# Patient Record
Sex: Female | Born: 1965 | Race: White | Hispanic: No | Marital: Married | Smoking: Current every day smoker
Health system: Southern US, Academic
[De-identification: ages and names within clinical notes are randomized; demographics above are authoritative.]

## PROBLEM LIST (undated history)

## (undated) DIAGNOSIS — M51369 Other intervertebral disc degeneration, lumbar region without mention of lumbar back pain or lower extremity pain: Secondary | ICD-10-CM

## (undated) DIAGNOSIS — F419 Anxiety disorder, unspecified: Secondary | ICD-10-CM

## (undated) DIAGNOSIS — E538 Deficiency of other specified B group vitamins: Secondary | ICD-10-CM

## (undated) DIAGNOSIS — E559 Vitamin D deficiency, unspecified: Secondary | ICD-10-CM

## (undated) HISTORY — PX: HX BACK SURGERY: SHX140

## (undated) HISTORY — DX: Other intervertebral disc degeneration, lumbar region without mention of lumbar back pain or lower extremity pain: M51.369

## (undated) HISTORY — PX: HX OTHER: 2100001105

## (undated) HISTORY — DX: Anxiety disorder, unspecified: F41.9

---

## 1997-03-31 ENCOUNTER — Other Ambulatory Visit (HOSPITAL_COMMUNITY): Payer: Self-pay | Admitting: OBSTETRICS/GYNECOLOGY

## 2010-01-28 LAB — ENTER/EDIT EXTERNAL COMMON LAB RESULTS
CHOLESTEROL: 167
HDL-CHOLESTEROL: 64
LDL (CALCULATED): 91
LDL CHOLESTEROL,DIRECT: 12
TRIGLYCERIDES: 60

## 2022-05-31 ENCOUNTER — Telehealth (INDEPENDENT_AMBULATORY_CARE_PROVIDER_SITE_OTHER): Payer: Self-pay | Admitting: Internal Medicine

## 2022-05-31 MED ORDER — BUPROPION HCL SR 100 MG TABLET,12 HR SUSTAINED-RELEASE
100.0000 mg | ORAL_TABLET | Freq: Two times a day (BID) | ORAL | 2 refills | Status: DC
Start: 2022-05-31 — End: 2022-10-05

## 2022-05-31 NOTE — Telephone Encounter (Signed)
Patient called needs to Wellbutrin 100 mg called in to [facility] pctn as well as wanted an appt but she hasn't been seen here since 2018, is it ok for me to schedule her an appt?

## 2022-06-29 ENCOUNTER — Other Ambulatory Visit (INDEPENDENT_AMBULATORY_CARE_PROVIDER_SITE_OTHER): Payer: Self-pay | Admitting: Internal Medicine

## 2022-09-11 ENCOUNTER — Other Ambulatory Visit: Payer: Self-pay

## 2022-09-11 ENCOUNTER — Emergency Department
Admission: EM | Admit: 2022-09-11 | Discharge: 2022-09-11 | Disposition: A | Discharge status: Home / Self Care | Payer: BC Managed Care – PPO | Attending: Family | Admitting: Family

## 2022-09-11 ENCOUNTER — Encounter (HOSPITAL_COMMUNITY): Payer: Self-pay | Admitting: Family

## 2022-09-11 ENCOUNTER — Inpatient Hospital Stay (HOSPITAL_COMMUNITY): Payer: BC Managed Care – PPO

## 2022-09-11 DIAGNOSIS — W19XXXA Unspecified fall, initial encounter: Secondary | ICD-10-CM

## 2022-09-11 DIAGNOSIS — W1839XA Other fall on same level, initial encounter: Secondary | ICD-10-CM | POA: Insufficient documentation

## 2022-09-11 DIAGNOSIS — S8252XA Displaced fracture of medial malleolus of left tibia, initial encounter for closed fracture: Secondary | ICD-10-CM

## 2022-09-11 DIAGNOSIS — S8253XA Displaced fracture of medial malleolus of unspecified tibia, initial encounter for closed fracture: Secondary | ICD-10-CM

## 2022-09-11 MED ORDER — TRAMADOL 50 MG TABLET
1.0000 | ORAL_TABLET | Freq: Four times a day (QID) | ORAL | 0 refills | Status: DC | PRN
Start: 2022-09-11 — End: 2022-10-10

## 2022-09-11 MED ORDER — IBUPROFEN 800 MG TABLET
800.0000 mg | ORAL_TABLET | Freq: Four times a day (QID) | ORAL | 0 refills | Status: DC | PRN
Start: 2022-09-11 — End: 2022-10-05

## 2022-09-11 NOTE — ED Triage Notes (Signed)
States she twisted left ankle and fell today at approx 1400. C/os pain in left ankle with swelling and bruising.

## 2022-09-11 NOTE — ED Provider Notes (Signed)
[facility]  ED Primary Provider Note  History of Present Illness   Chief Complaint   Patient presents with    Fall    Leg Swelling    Ankle Pain     Arrival: The patient arrived by Car  Stephanie Gallegos is a 56 y.o. female who had concerns including Fall, Leg Swelling, and Ankle Pain.  Patient presents to emergency room for evaluation of left ankle swelling after a fall prior to arrival.  Patient states she twisted her ankle is having pain along the medial aspect of the ankle/foot area.  She states her pain is mild at rest but does increase with attempting to ambulate.  Patient states her pain is currently a 5/10.  She declines pain management.  She denies any other injuries or pain.    Review of Systems   All other systems reviewed and are negative except as noted.    Historical Data   History Reviewed This Encounter: Medical History  Surgical History  Family History  Social History    Physical Exam   ED Triage Vitals [09/11/22 1643]   BP (Non-Invasive) (!) 140/104   Heart Rate 92   Respiratory Rate 18   Temperature 36.6 C (97.9 F)   SpO2 99 %   Weight 72.6 kg (160 lb)   Height 1.803 m (5\' 11" )       Constitutional:  56 y.o. female who appears in no distress. Normal color, no cyanosis.   HENT:   Head: Normocephalic and atraumatic.   Mouth/Throat: Oropharynx is clear and moist.   Eyes: EOMI, PERRL   Neck: Trachea midline. Neck supple.  Cardiovascular: RRR, S1, S2 No murmurs, rubs or gallops. Intact distal pulses.  Pulmonary/Chest: BS clear and equal bilaterally.  Respirations even and nonlabored.  No respiratory distress. No wheezes, rales or chest tenderness.   Abdominal: Bowel sounds present and normal. Abdomen soft, no tenderness, no rebound and no guarding.  Back: No midline spinal tenderness, no paraspinal tenderness, no CVA tenderness.           Musculoskeletal:  Left ankle noted with tenderness in the medial aspect of the malleolus.  Soft tissue swelling noted..  Skin: warm  and dry. No rash, erythema, pallor or cyanosis  Psychiatric: normal mood and affect. Behavior is normal.   Neurological: Patient keenly alert and responsive, easily able to raise eyebrows, facial muscles/expressions symmetric, speaking in fluent sentences, moving all extremities equally and fully, normal gait  Patient Data   Labs Ordered/Reviewed - No data to display  XR ANKLE LEFT 2 VIEW   Final Result by Edi, Radresults In (11/13 1604)   SOFT TISSUE SWELLING. NO  FRACTURE IDENTIFIED.                   Radiologist location ID: [id number]         XR FOOT LEFT 2 VIEW   Final Result by Edi, Radresults In (11/13 1605)   Fracture of the medial malleolus with soft tissue swelling.                Radiologist location ID: [id number]           Medical Decision Making        Medical Decision Making  Will discharge patient home to follow-up outpatient with Orthopedic.  Called and discussed patient with Dr. [id number] who is on-call for Orthopedics.  He states the place that posterior splint and have the patient follow up in his office,  they can call tomorrow for appointment.  Will place patient on pain management.  She is to ice the area 5 to 6 times a day for 15-20 minutes.  Keep the area elevated.  Home education provided. Patient return to the ER for any problems or worsening of symptoms    Amount and/or Complexity of Data Reviewed  Radiology: ordered. Decision-making details documented in ED Course.    Risk  Prescription drug management.    Critical Care  Total time providing critical care: 0 minutes      ED Course as of 09/11/22 1718   Mon Sep 11, 2022   1708 Spoke to dr Nicholes Stairs who states to place a posterior splint and have her call the office tomorrow.             Clinical Impression   Closed fracture of medial malleolus - left  (Primary)   Fall, initial encounter       Disposition: Discharged  Current Discharge Medication List        START taking these medications    Details   Ibuprofen (MOTRIN) 800 mg Oral Tablet Take  1 Tablet (800 mg total) by mouth Four times a day as needed for Pain for up to 5 days  Qty: 20 Tablet, Refills: 0      traMADoL (ULTRAM) 50 mg Oral Tablet Take 1 Tablet (50 mg total) by mouth Every 6 hours as needed for Pain for up to 3 days  Qty: 12 Tablet, Refills: 0

## 2022-09-11 NOTE — ED Nurses Note (Signed)
Discharged home, received written and verbal discharge instructions by provider with understanding voiced. Procast applied, pt already has crutches in vehicle. Made aware to follow up with ortho, return to ER for any worsening symptoms. Leaving ER at this time via wheelchair with family.

## 2022-09-11 NOTE — Discharge Instructions (Signed)
FOLLOW UP WITH THE PRIMARY CARE PROVIDER AS SOON AS POSSIBLE BUT NO LATER THAN 3 DAYS NOW.  IF NO PRIMARY CARE PROVIDER EXISTS, THEN THE PATIENT IS INSTRUCTED TO ESTABLISH CARE WITH A PRIMARY CARE PROVIDER. FOLLOW-UP WITH ANY SPECIALIST PROVIDER AS INDICATED AS SOON AS POSSIBLE BUT NO LATER THAN 3 DAYS.  NOTIFY THE PRIMARY CARE PROVIDER THAT YOU WERE IN THE EMERGENCY DEPARTMENT WITHIN 24 HOURS OF DISCHARGE TO FOLLOW-UP ON YOUR RESULTS AND/OR TREATMENTS.  RETURN TO THE EMERGENCY DEPARTMENT IMMEDIATELY IF NEEDED, NO BETTER, WORSE, NEW SYMPTOMS ARISE, OR YOU CANNOT FOLLOW-UP WITH YOUR PRIMARY CARE PROVIDER IN THE PRESCRIBED TIMEFRAME.

## 2022-09-15 ENCOUNTER — Encounter (INDEPENDENT_AMBULATORY_CARE_PROVIDER_SITE_OTHER): Payer: BC Managed Care – PPO | Admitting: Family

## 2022-09-29 ENCOUNTER — Encounter (INDEPENDENT_AMBULATORY_CARE_PROVIDER_SITE_OTHER): Payer: Self-pay | Admitting: Internal Medicine

## 2022-10-05 ENCOUNTER — Encounter (INDEPENDENT_AMBULATORY_CARE_PROVIDER_SITE_OTHER): Payer: Self-pay | Admitting: Internal Medicine

## 2022-10-05 ENCOUNTER — Ambulatory Visit (INDEPENDENT_AMBULATORY_CARE_PROVIDER_SITE_OTHER): Payer: BC Managed Care – PPO | Admitting: Internal Medicine

## 2022-10-05 ENCOUNTER — Other Ambulatory Visit: Payer: Self-pay

## 2022-10-05 VITALS — BP 135/97 | HR 94 | Resp 16 | Ht 70.0 in

## 2022-10-05 DIAGNOSIS — W19XXXD Unspecified fall, subsequent encounter: Secondary | ICD-10-CM

## 2022-10-05 DIAGNOSIS — S82892A Other fracture of left lower leg, initial encounter for closed fracture: Secondary | ICD-10-CM

## 2022-10-05 DIAGNOSIS — F320 Major depressive disorder, single episode, mild: Secondary | ICD-10-CM

## 2022-10-05 DIAGNOSIS — M25552 Pain in left hip: Secondary | ICD-10-CM

## 2022-10-05 DIAGNOSIS — S82892D Other fracture of left lower leg, subsequent encounter for closed fracture with routine healing: Secondary | ICD-10-CM

## 2022-10-05 DIAGNOSIS — M25352 Other instability, left hip: Secondary | ICD-10-CM

## 2022-10-05 MED ORDER — CYCLOBENZAPRINE 10 MG TABLET
10.0000 mg | ORAL_TABLET | Freq: Three times a day (TID) | ORAL | 3 refills | Status: DC | PRN
Start: 2022-10-05 — End: 2023-11-22

## 2022-10-05 MED ORDER — IBUPROFEN 800 MG TABLET
800.0000 mg | ORAL_TABLET | Freq: Four times a day (QID) | ORAL | 3 refills | Status: AC | PRN
Start: 2022-10-05 — End: 2023-04-03

## 2022-10-05 MED ORDER — BUPROPION HCL SR 100 MG TABLET,12 HR SUSTAINED-RELEASE
100.0000 mg | ORAL_TABLET | Freq: Two times a day (BID) | ORAL | 3 refills | Status: DC
Start: 2022-10-05 — End: 2023-07-10

## 2022-10-05 MED ORDER — SERTRALINE 100 MG TABLET
ORAL_TABLET | ORAL | 3 refills | Status: DC
Start: 2022-10-05 — End: 2023-08-23

## 2022-10-05 MED ORDER — DEXAMETHASONE SODIUM PHOSPHATE 4 MG/ML INJECTION SOLUTION
4.0000 mg | INTRAMUSCULAR | Status: AC
Start: 2022-10-05 — End: 2022-10-05
  Administered 2022-10-05: 4 mg via INTRAMUSCULAR

## 2022-10-05 MED ORDER — KETOROLAC 60 MG/2 ML INTRAMUSCULAR SOLUTION
60.0000 mg | Freq: Once | INTRAMUSCULAR | Status: AC
Start: 2022-10-05 — End: 2022-10-05
  Administered 2022-10-05: 60 mg via INTRAMUSCULAR

## 2022-10-05 MED ORDER — CYANOCOBALAMIN (VIT B-12) 1,000 MCG/ML INJECTION SOLUTION
1000.0000 ug | INTRAMUSCULAR | 3 refills | Status: DC
Start: 2022-10-05 — End: 2023-10-01

## 2022-10-05 MED ORDER — KETOROLAC 30 MG/ML (1 ML) INJECTION SOLUTION
60.0000 mg | Freq: Once | INTRAMUSCULAR | Status: DC
Start: 2022-10-05 — End: 2022-10-05

## 2022-10-05 NOTE — Progress Notes (Signed)
[facility]  [address]  [place] [address]       Name: Stephanie Gallegos MRN:  [id number]   Date: 10/05/2022 Age: 56 y.o.       Chief Complaint:    Chief Complaint   Patient presents with    Hip Pain     Lt--Wanting injection--        HPI:  Stephanie Gallegos is a 56 y.o. female who presents to the office today for follow-up.  The patient is actually sustained a injury secondary to a fall in her basement.  She states that she fell against the left side of her body whenever she tripped in her basement.  She sustained an injury to the left ankle in addition to her left lateral hip region.  She was seen by Orthopedics and x-rays were performed which revealed a fracture of her ankle and she currently is in a splint for this and I do not have the actual records from her x-ray to determine the extent of this fracture.  However she has been having increasing amounts of pain of her left hip to the point that she states whenever she starts to get up to walk or whenever she has any movement of her left hip that she has significant pain.  She states from time to time he will feel like it is catching.  She is unable to walk on this she is not had any imaging to be performed of this.  She is not tried any medications as she does not want to be on any medications if at all possible.    Past Medical History:  History reviewed. No pertinent past medical history.  Past Medical History was reviewed and is negative for below    History reviewed. No pertinent surgical history.   Current Outpatient Medications   Medication Sig    buPROPion (WELLBUTRIN SR) 100 mg Oral tablet sustained-release 12 hr Take 1 Tablet (100 mg total) by mouth Twice daily    cyanocobalamin (VITAMIN B12) 1,000 mcg/mL Injection Solution Inject 1 mL (1,000 mcg total) into the muscle Every 30 days    cyclobenzaprine (FLEXERIL) 10 mg Oral Tablet Take 1 Tablet (10 mg total) by mouth Every 8 hours as needed for Muscle spasms     Ibuprofen (MOTRIN) 800 mg Oral Tablet Take 1 Tablet (800 mg total) by mouth Four times a day as needed for Pain for up to 180 days    sertraline (ZOLOFT) 100 mg Oral Tablet TAKE 1 & 1/2 (ONE & ONE-HALF) TABLETS BY MOUTH ONCE DAILY     No Known Allergies    Family History:  Family Medical History:    None           Social History:   Social History     Tobacco Use   Smoking Status Every Day    Types: Cigarettes    Passive exposure: Never   Smokeless Tobacco Never     Social History     Substance and Sexual Activity   Alcohol Use Never     Social History     Occupational History    Not on file       Review of Systems:  Review of systems as discussed in HPI    Problem List:  There is no problem list on file for this patient.      Physical Examination:  BP (!) 135/97 (Site: Left, Patient Position: Sitting, Cuff Size: Adult)  Pulse 94   Resp 16   Ht 1.778 m (5\' 10" )   LMP  (LMP Unknown)   SpO2 99%   BMI 22.96 kg/m       Physical Exam  Vitals and nursing note reviewed.   Constitutional:       General: She is not in acute distress.     Appearance: Normal appearance.   HENT:      Head: Normocephalic and atraumatic.      Right Ear: Tympanic membrane normal.      Left Ear: Tympanic membrane normal.      Nose: Nose normal.      Mouth/Throat:      Mouth: Mucous membranes are moist.      Pharynx: Oropharynx is clear.   Eyes:      General:         Right eye: No discharge.         Left eye: No discharge.      Conjunctiva/sclera: Conjunctivae normal.      Pupils: Pupils are equal, round, and reactive to light.   Cardiovascular:      Rate and Rhythm: Normal rate and regular rhythm.      Pulses:           Carotid pulses are 0 on the right side and 0 on the left side.       Popliteal pulses are 2+ on the right side and 2+ on the left side.        Dorsalis pedis pulses are 2+ on the right side and 2+ on the left side.      Heart sounds: Normal heart sounds.   Pulmonary:      Effort: Pulmonary effort is normal.      Breath  sounds: Normal breath sounds. No wheezing, rhonchi or rales.   Abdominal:      General: Bowel sounds are normal. There is no distension.      Palpations: Abdomen is soft.      Tenderness: There is no abdominal tenderness. There is no rebound.   Musculoskeletal:         General: No signs of injury.      Cervical back: Normal range of motion. No tenderness.      Left hip: Deformity, tenderness and bony tenderness present. Decreased range of motion. Decreased strength.   Lymphadenopathy:      Cervical: No cervical adenopathy.   Skin:     General: Skin is warm.      Capillary Refill: Capillary refill takes less than 2 seconds.      Coloration: Skin is not jaundiced.   Neurological:      General: No focal deficit present.      Mental Status: She is alert and oriented to person, place, and time.      Cranial Nerves: No cranial nerve deficit.      Motor: No weakness.   Psychiatric:         Behavior: Behavior normal.          Health Maintenance:  Health Maintenance   Topic Date Due    Pap smear  Never done    Colonoscopy  Never done    Mammography  Never done    Shingles Vaccine (1 of 2) Never done    Influenza Vaccine (1) Never done    Covid-19 Vaccine (4 - 2023-24 season) 06/30/2022    Depression Screening  10/06/2023    Meningococcal Vaccine  Aged Out    Pneumococcal Vaccine,  Age 55-64  Aged Out    Adult Tdap-Td  Discontinued    Hepatitis B Vaccine  Discontinued    HIV Screening  Discontinued        Assessment:      ICD-10-CM    1. Hip pain, acute, left  M25.552 My concern is that this patient is having severe pain of her left hip that is profound whenever she stands and attempts to extend her hip with walking.  She however is noted to have increasing amounts of pain even with ambulation and on physical exam she has extreme instability of her left hip secondary to severe pain with minimal range of motion.  My concern at this point is that she could be suffering from either a muscle tear, ligament tear, tendon tear or more  importantly a stress fracture of her hip.  Because of her examination I really feel like that an x-ray is going to not provide the appropriate imaging necessary for her condition and I feel that she needs a stat MRI of the hip.    Ibuprofen (MOTRIN) 800 mg Oral Tablet     ketorolac (TORADOL) 30 mg/mL injection     dexAMETHasone 4 mg/mL injection     MRI HIP LEFT WO CONTRAST      2. 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 Closed left ankle fracture  S82.892A Currently stable with splint and I am currently needing to get records as to the extent of her injury.         Orders Placed This Encounter    MRI HIP LEFT WO CONTRAST    cyclobenzaprine (FLEXERIL) 10 mg Oral Tablet    cyanocobalamin (VITAMIN B12) 1,000 mcg/mL Injection Solution    sertraline (ZOLOFT) 100 mg Oral Tablet    buPROPion (WELLBUTRIN SR) 100 mg Oral tablet sustained-release 12 hr    Ibuprofen (MOTRIN) 800 mg Oral Tablet    ketorolac (TORADOL) 30 mg/mL injection    dexAMETHasone 4 mg/mL injection       Depression screening is negative. PHQ 2 Total: 0       Follow up:  No follow-ups on file.    This note was partially created using voice recognition software and is inherently subject to errors including those of syntax and "sound alike " substitutions which may escape proof reading.  In such instances, original meaning may be extrapolated by contextual derivation.    Lucia Gaskins, DO  10/05/2022, 16:27

## 2022-10-10 ENCOUNTER — Other Ambulatory Visit (INDEPENDENT_AMBULATORY_CARE_PROVIDER_SITE_OTHER): Payer: Self-pay | Admitting: Internal Medicine

## 2022-10-10 MED ORDER — TRAMADOL 50 MG TABLET
1.0000 | ORAL_TABLET | Freq: Four times a day (QID) | ORAL | 0 refills | Status: DC | PRN
Start: 2022-10-10 — End: 2022-11-21

## 2022-10-11 ENCOUNTER — Encounter (INDEPENDENT_AMBULATORY_CARE_PROVIDER_SITE_OTHER): Payer: Self-pay | Admitting: Internal Medicine

## 2022-10-11 DIAGNOSIS — M25352 Other instability, left hip: Secondary | ICD-10-CM

## 2022-10-11 DIAGNOSIS — M25552 Pain in left hip: Secondary | ICD-10-CM

## 2022-10-11 IMAGING — MR MRI HIP LT W/O CONTRAST
5 of 7 series · 24 of 40 positions shown · IV contrast (gadolinium)
Comparison: None available

﻿EXAM:  74771   MRI HIP LT W/O CONTRAST
INDICATION: 56-year-old female with left hip pain for a few months. Patient sustained trauma due to fall.  Instability.  No history of prior surgery.
TECHNIQUE: Multiplanar, multisequential MRI of the left hip and pelvis was performed without gadolinium contrast.

[Series 7: T2 fat-sat · axial · left · 4.5mm · 0.94mm/px · z∈[-30,+115]mm · 6 of 30 slices shown (1 of 3)]
[im 1/30]
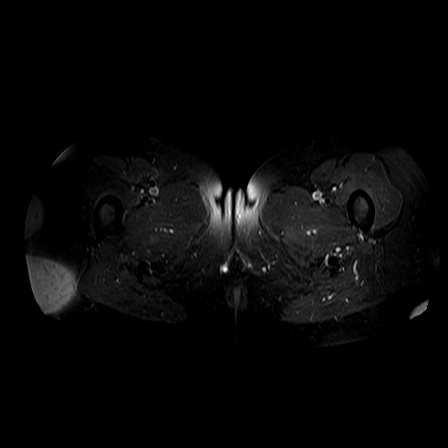
[im 6/30]
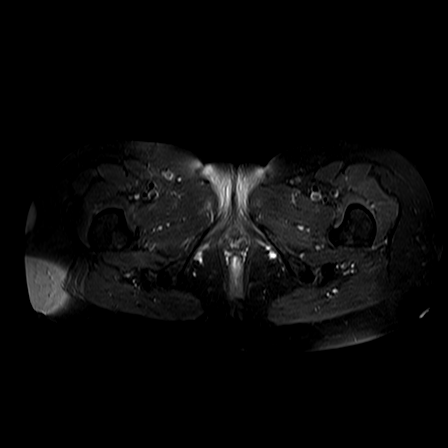
[im 12/30]
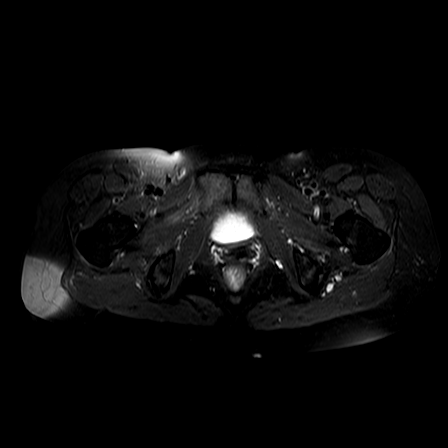
[im 18/30]
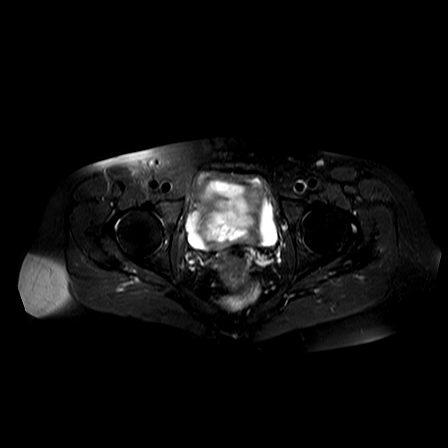
[im 24/30]
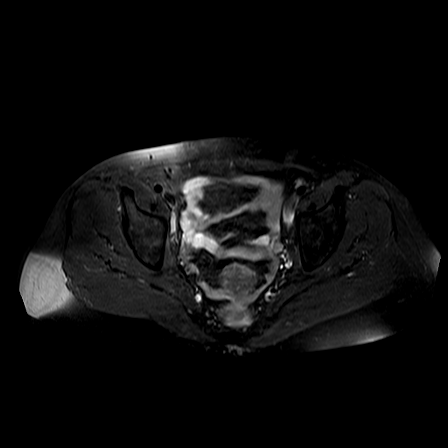
[im 30/30]
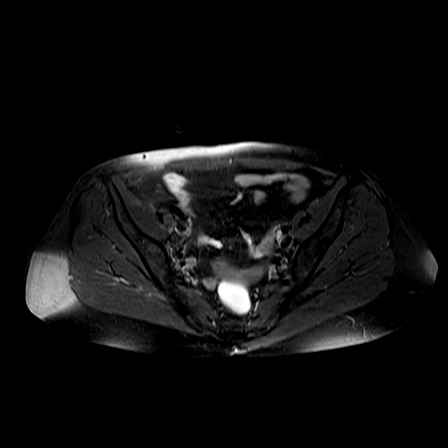

[Series 8: T1 · axial · left · 4.5mm · 0.82mm/px · z∈[-30,+115]mm · 7 of 30 slices shown (1 of 2)]
[im 1/30]
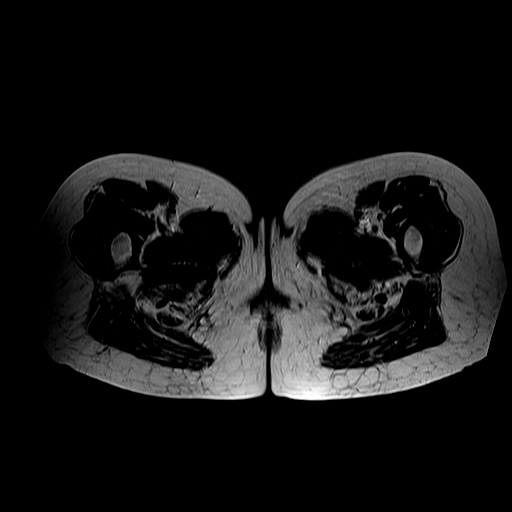
[im 5/30]
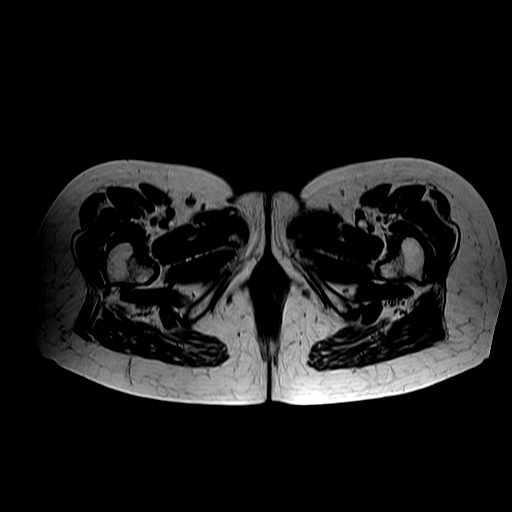
[im 10/30]
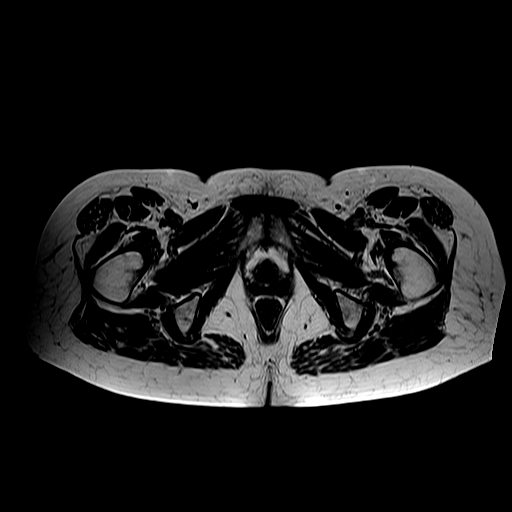
[im 15/30]
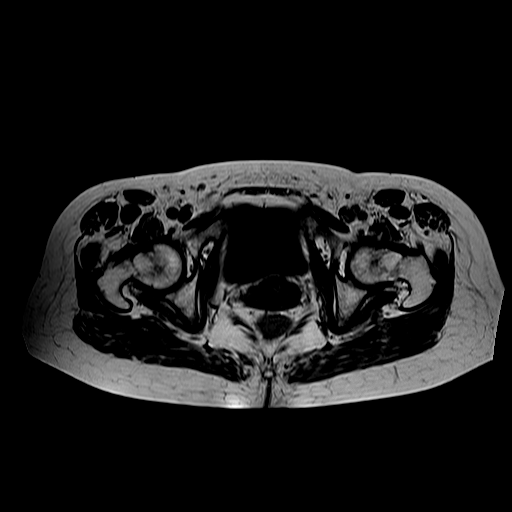
[im 20/30]
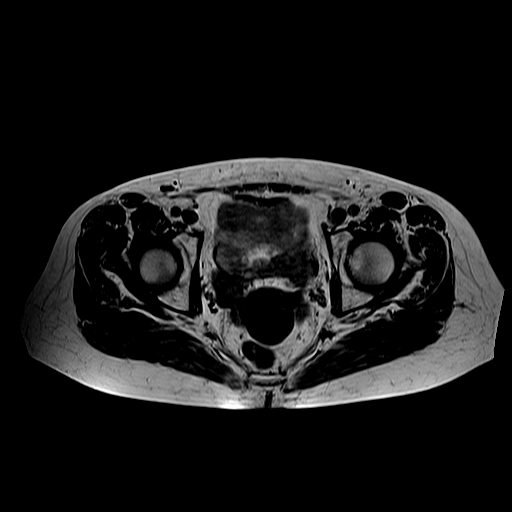
[im 25/30]
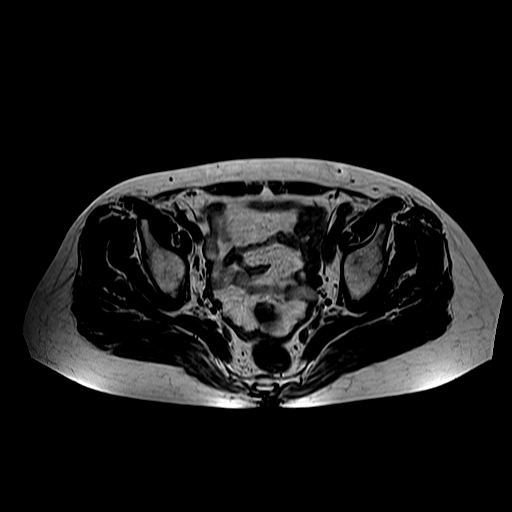
[im 30/30]
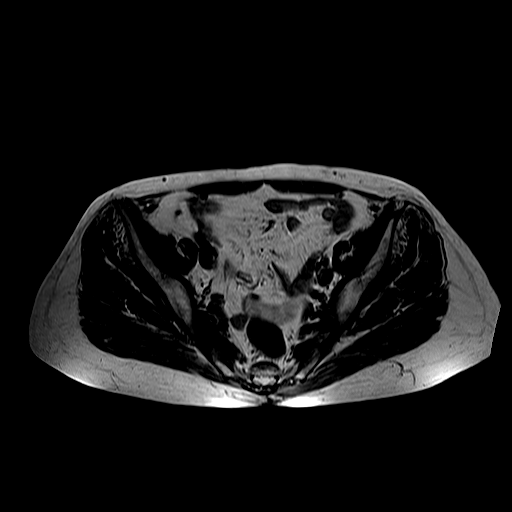

[Series 10: T1 · coronal · left · 4.0mm · 0.82mm/px · 1 of 20 slices shown (2 of 2)]
[im 1/20]
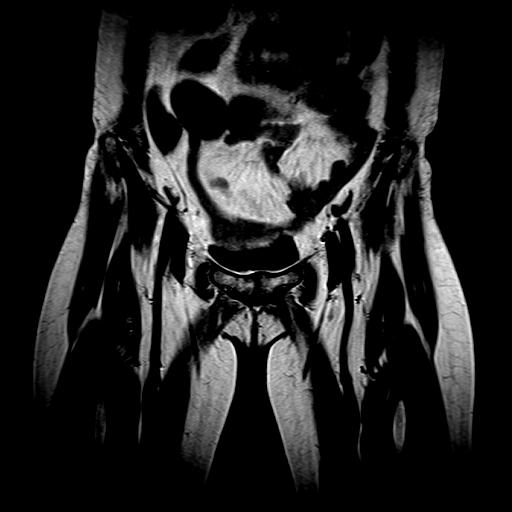

[Series 11: T2 fat-sat · coronal · left · 4.0mm · 0.82mm/px · 5 of 20 slices shown (2 of 3)]
[im 1/20]
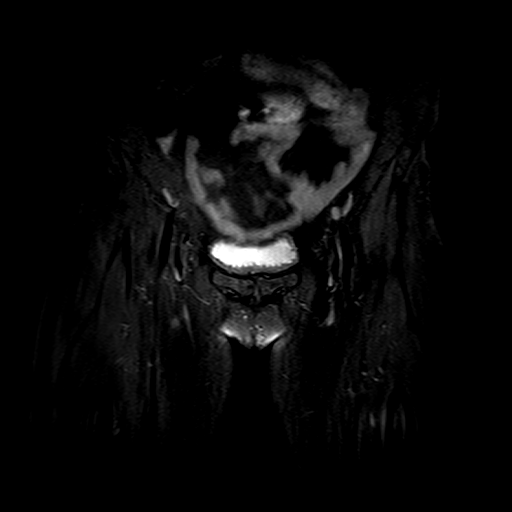
[im 5/20]
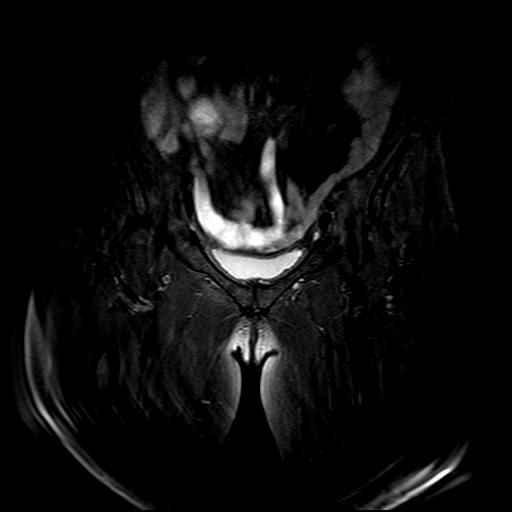
[im 10/20]
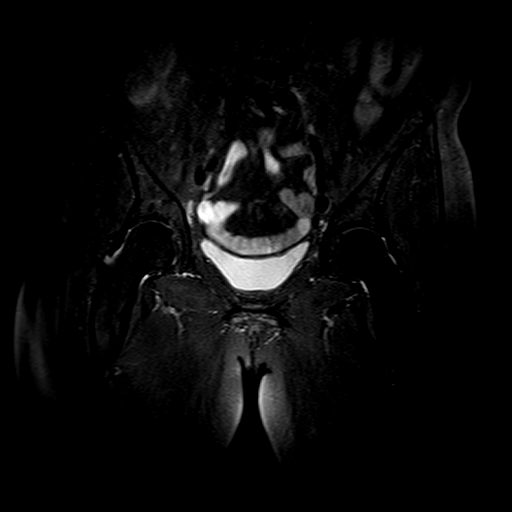
[im 15/20]
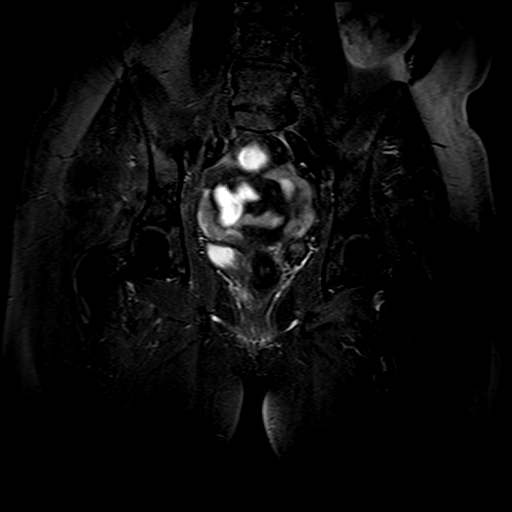
[im 20/20]
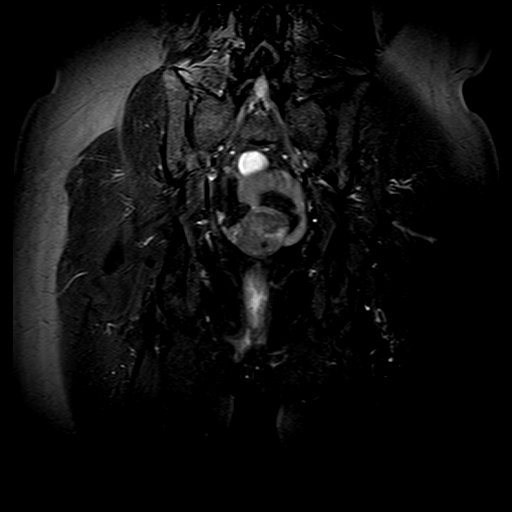

[Series 13: T2 fat-sat · sagittal · left · 4.5mm · 0.78mm/px · 5 of 20 slices shown (3 of 3)]
[im 1/20]
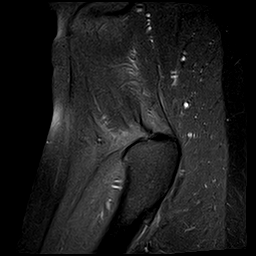
[im 5/20]
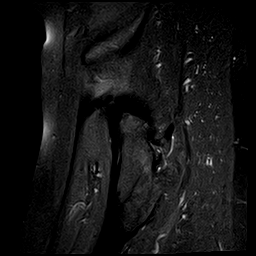
[im 10/20]
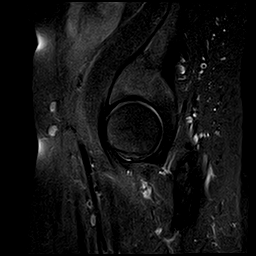
[im 15/20]
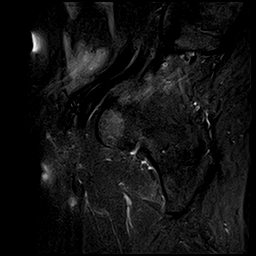
[im 20/20]
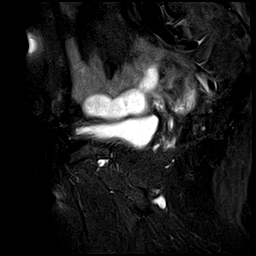

[24 of 40 positions shown; findings below may reference images not displayed]

FINDINGS: No acute fractures or bone bruises are seen involving the pelvis including left hip.  No evidence of avascular necrosis of the femoral head is seen.  Mild osteoarthritis of both hip joints is noted.  No evidence of acetabular labral tear.

Soft tissues at the left hip do not show any acute findings.
IMPRESSION: 1. No acute bony lesions of pelvis and left hip.  No evidence of AVN.

2. Mild degenerative osteoarthritis of both hip joints.

3. No evidence of acetabular labral tear. Soft tissues are unremarkable.

## 2022-10-12 ENCOUNTER — Other Ambulatory Visit (INDEPENDENT_AMBULATORY_CARE_PROVIDER_SITE_OTHER): Payer: Self-pay | Admitting: Internal Medicine

## 2022-10-12 DIAGNOSIS — M545 Low back pain, unspecified: Secondary | ICD-10-CM

## 2022-10-12 DIAGNOSIS — M549 Dorsalgia, unspecified: Secondary | ICD-10-CM

## 2022-10-13 ENCOUNTER — Encounter (INDEPENDENT_AMBULATORY_CARE_PROVIDER_SITE_OTHER): Payer: Self-pay | Admitting: Internal Medicine

## 2022-10-13 DIAGNOSIS — M549 Dorsalgia, unspecified: Secondary | ICD-10-CM

## 2022-10-13 IMAGING — DX XRAY LUMBAR SPINE MINIMUM 4 VIEWS
1 series · 4 of 4 positions shown · non-contrast
Comparison: None available.

﻿EXAM:  [id number]      XRAY LUMBAR SPINE MINIMUM 4 VIEWS
INDICATION: Trauma due to fall in November.  Left back pain and left hip pain.  Left leg numbness.

[Series 1: obl · 0.14mm/px · 4 of 4 slices shown]
[im 1/4]
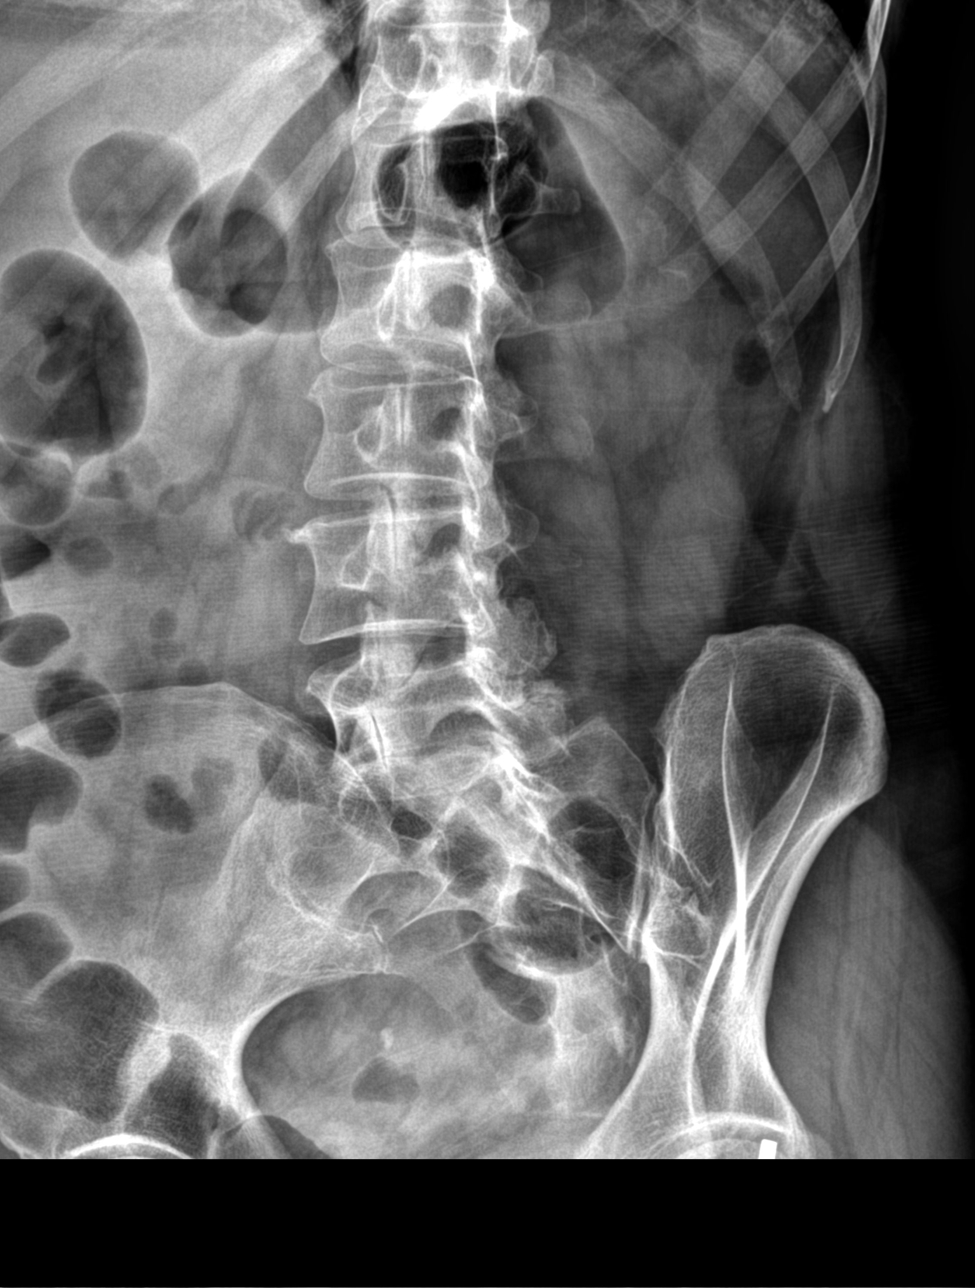
[im 2/4]
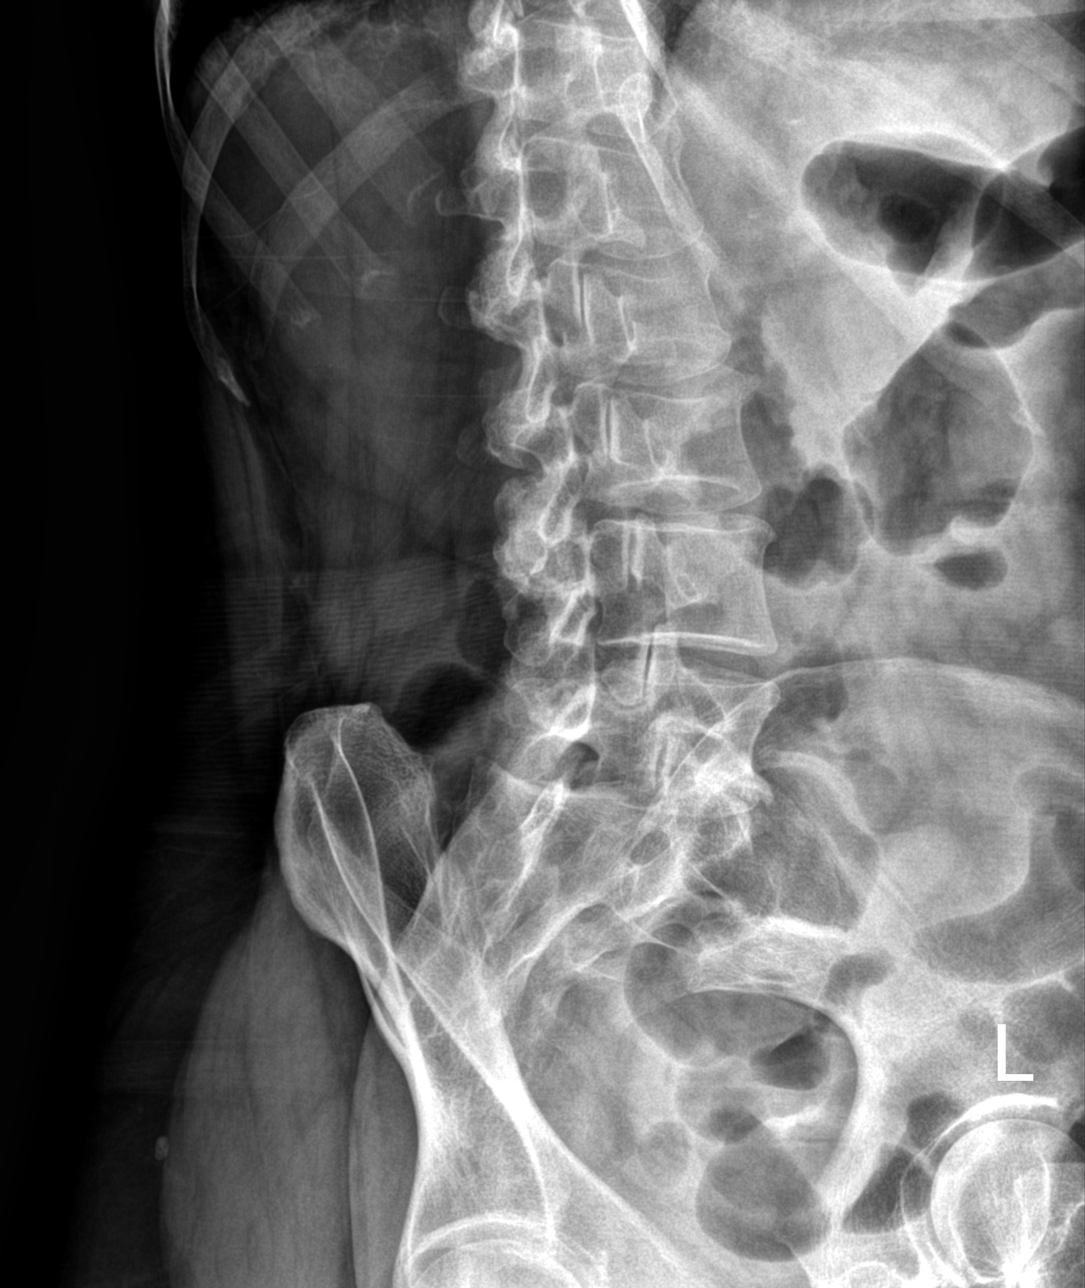
[im 3/4]
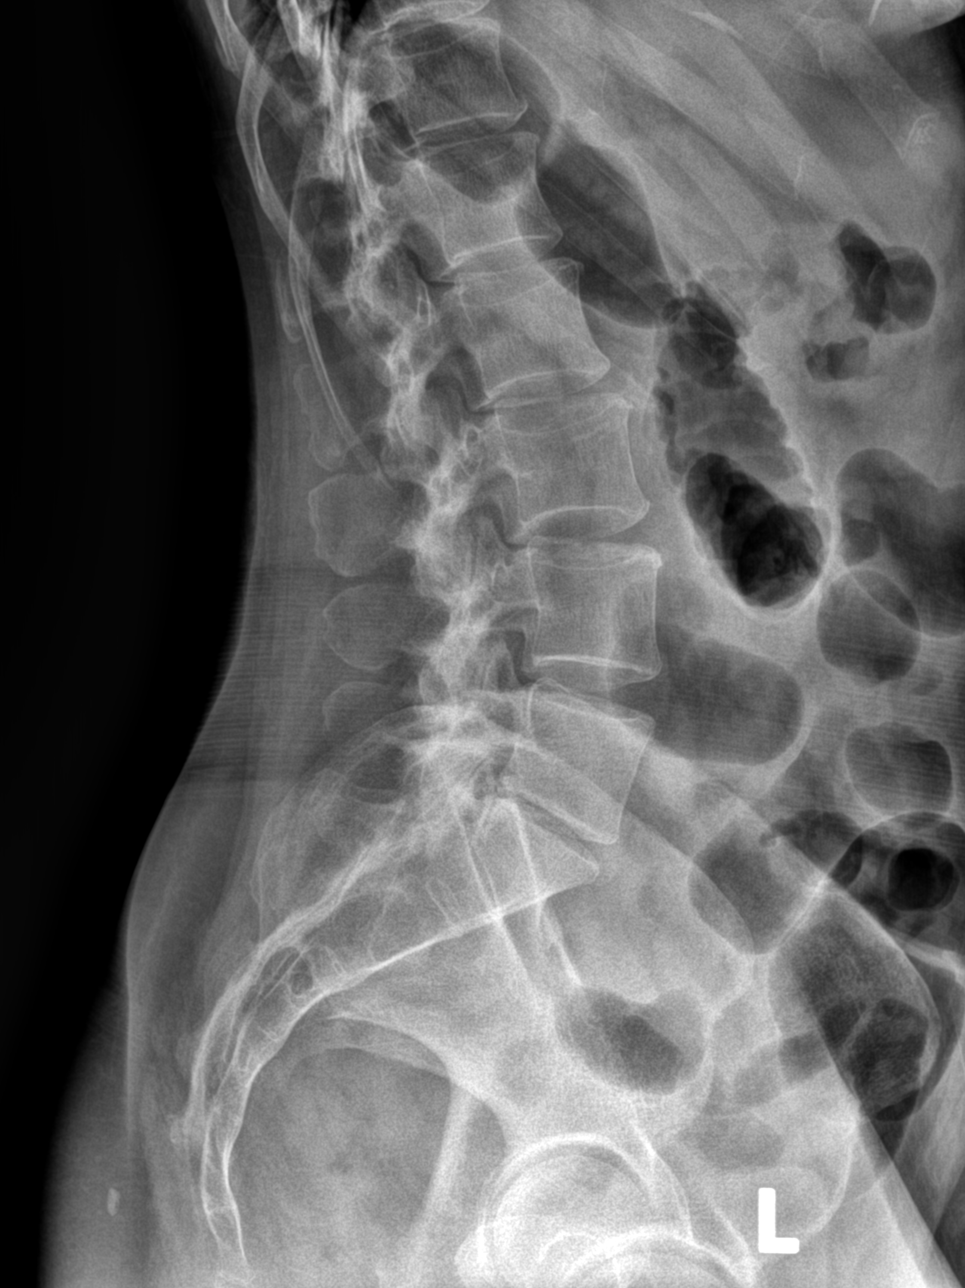
[im 4/4]
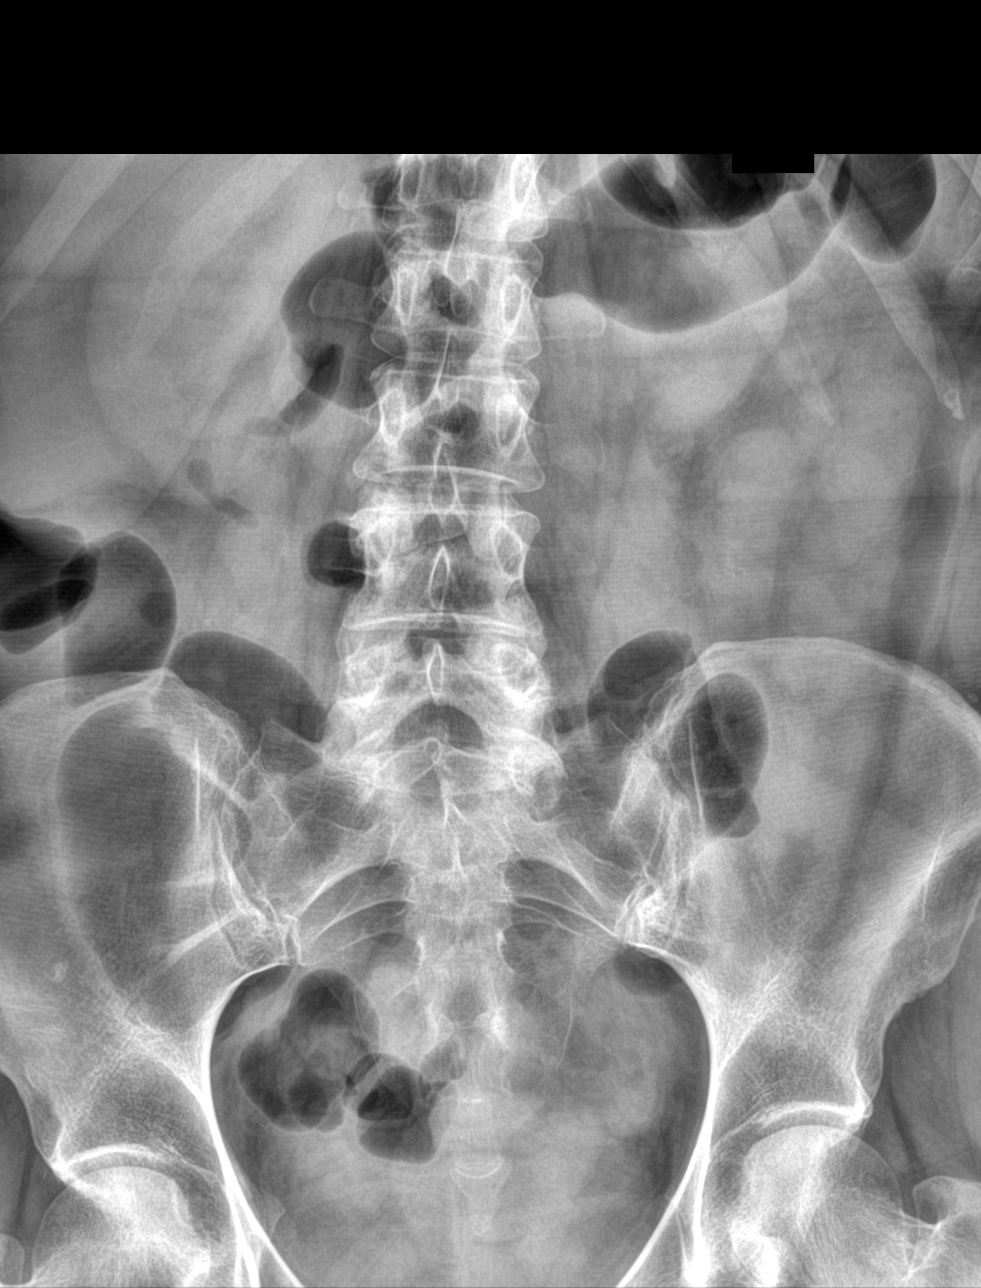

[4 of 4 positions shown; findings below may reference images not displayed]

FINDINGS: Five lumbar segments are noted.  No acute bony lesions of lumbar vertebrae are seen.  Moderate degenerative disc disease and facet arthropathy at L5-S1 level.  Paravertebral soft tissues are unremarkable.
IMPRESSION: No acute findings. Degenerative disc disease and facet arthropathy at L5-S1 level.  If symptoms are persistent and not responding to conservative treatment, additional imaging may be considered with MRI.

## 2022-10-17 ENCOUNTER — Other Ambulatory Visit: Payer: Self-pay

## 2022-10-26 ENCOUNTER — Ambulatory Visit (INDEPENDENT_AMBULATORY_CARE_PROVIDER_SITE_OTHER): Payer: BC Managed Care – PPO | Admitting: Internal Medicine

## 2022-10-26 ENCOUNTER — Other Ambulatory Visit: Payer: Self-pay

## 2022-10-26 ENCOUNTER — Encounter (INDEPENDENT_AMBULATORY_CARE_PROVIDER_SITE_OTHER): Payer: Self-pay | Admitting: Internal Medicine

## 2022-10-26 VITALS — BP 134/98 | HR 103 | Resp 16 | Ht 70.0 in

## 2022-10-26 DIAGNOSIS — R102 Pelvic and perineal pain: Secondary | ICD-10-CM | POA: Insufficient documentation

## 2022-10-26 DIAGNOSIS — M25552 Pain in left hip: Secondary | ICD-10-CM

## 2022-10-26 NOTE — Progress Notes (Signed)
[facility]  [address]  [place] [address]       Name: Stephanie Gallegos MRN:  [id number]   Date: 10/26/2022 Age: 56 y.o.       Chief Complaint:    Chief Complaint   Patient presents with    Hip Pain     Lt         HPI:  Stephanie Gallegos is a 56 y.o. female who presents to the office today for follow-up.  Patient is actually doing relatively well and except for minor issues as described below which I have addressed in total with the patient.        Past Medical History:  No past medical history on file.  Past Medical History was reviewed and is negative for below    No past surgical history on file.   Current Outpatient Medications   Medication Sig    buPROPion (WELLBUTRIN SR) 100 mg Oral tablet sustained-release 12 hr Take 1 Tablet (100 mg total) by mouth Twice daily    cyanocobalamin (VITAMIN B12) 1,000 mcg/mL Injection Solution Inject 1 mL (1,000 mcg total) into the muscle Every 30 days    cyclobenzaprine (FLEXERIL) 10 mg Oral Tablet Take 1 Tablet (10 mg total) by mouth Every 8 hours as needed for Muscle spasms    Ibuprofen (MOTRIN) 800 mg Oral Tablet Take 1 Tablet (800 mg total) by mouth Four times a day as needed for Pain for up to 180 days    sertraline (ZOLOFT) 100 mg Oral Tablet TAKE 1 & 1/2 (ONE & ONE-HALF) TABLETS BY MOUTH ONCE DAILY    traMADoL (ULTRAM) 50 mg Oral Tablet Take 1-2 Tablets (50-100 mg total) by mouth Every 6 hours as needed for Pain for up to 30 days     No Known Allergies    Family History:  Family Medical History:    None           Social History:   Social History     Tobacco Use   Smoking Status Every Day    Types: Cigarettes    Passive exposure: Never   Smokeless Tobacco Never     Social History     Substance and Sexual Activity   Alcohol Use Never     Social History     Occupational History    Not on file       Review of Systems:  Review of systems as discussed in HPI    Problem List:  Patient Active Problem List   Diagnosis    Pelvic pain    Pain  of left hip       Physical Examination:  BP (!) 134/98 (Site: Right, Patient Position: Sitting, Cuff Size: Adult)   Pulse (!) 103   Resp 16   Ht 1.778 m (5\' 10" )   LMP  (LMP Unknown)   SpO2 100%   BMI 22.96 kg/m       Physical Exam  Vitals and nursing note reviewed.   Constitutional:       General: She is not in acute distress.     Appearance: Normal appearance.   HENT:      Head: Normocephalic and atraumatic.      Right Ear: Tympanic membrane normal.      Left Ear: Tympanic membrane normal.      Nose: Nose normal.      Mouth/Throat:      Mouth: Mucous membranes are moist.  Pharynx: Oropharynx is clear.   Eyes:      General:         Right eye: No discharge.         Left eye: No discharge.      Conjunctiva/sclera: Conjunctivae normal.      Pupils: Pupils are equal, round, and reactive to light.   Cardiovascular:      Rate and Rhythm: Normal rate and regular rhythm.      Pulses:           Carotid pulses are 0 on the right side and 0 on the left side.       Popliteal pulses are 2+ on the right side and 2+ on the left side.        Dorsalis pedis pulses are 2+ on the right side and 2+ on the left side.      Heart sounds: Normal heart sounds.   Pulmonary:      Effort: Pulmonary effort is normal.      Breath sounds: Normal breath sounds. No wheezing, rhonchi or rales.   Abdominal:      General: Bowel sounds are normal. There is no distension.      Palpations: Abdomen is soft.      Tenderness: There is abdominal tenderness in the left lower quadrant. There is guarding. There is no rebound. Positive signs include obturator sign.   Musculoskeletal:         General: No signs of injury.      Cervical back: Normal range of motion. No tenderness.      Left hip: Deformity, tenderness and bony tenderness present. Decreased range of motion. Decreased strength.   Lymphadenopathy:      Cervical: No cervical adenopathy.   Skin:     General: Skin is warm.      Capillary Refill: Capillary refill takes less than 2 seconds.       Coloration: Skin is not jaundiced.   Neurological:      General: No focal deficit present.      Mental Status: She is alert and oriented to person, place, and time.      Cranial Nerves: No cranial nerve deficit.      Motor: No weakness.   Psychiatric:         Behavior: Behavior normal.              Health Maintenance:  Health Maintenance   Topic Date Due    Colonoscopy  Never done    Mammography  Never done    Influenza Vaccine (1) Never done    Meningococcal Vaccine  Aged Out    Adult Tdap-Td  Discontinued    Depression Screening  Discontinued    Hepatitis B Vaccine  Discontinued    Pap smear  Discontinued    HIV Screening  Discontinued    Shingles Vaccine  Discontinued    Covid-19 Vaccine  Discontinued    Pneumococcal Vaccine, Age 74-64  Discontinued        Assessment:      ICD-10-CM    1. Pelvic pain  R10.2 CT PELVIS W IV CONTRAST      2. Pain of left hip  M25.552 CT PELVIS W IV CONTRAST         Orders Placed This Encounter    CT PELVIS W IV CONTRAST                      Follow up:  No follow-ups on  file.    This note was partially created using voice recognition software and is inherently subject to errors including those of syntax and "sound alike " substitutions which may escape proof reading.  In such instances, original meaning may be extrapolated by contextual derivation.    Nettie Elm, DO  10/26/2022, 13:00

## 2022-10-26 NOTE — Procedures (Signed)
[facility]  [address]  [place] [address]    Procedure Note    Name: JAZIAH KWASNIK MRN:  [id number]   Date: 10/26/2022 Age: 56 y.o.  DOB:   1966-07-05       Large Joint Arthrocentesis: L hip joint  Indications: pain  Details: 21 G needle, lateral approach  Outcome: tolerated well, no immediate complications  Procedure, treatment alternatives, risks and benefits explained, specific risks discussed. Consent was given by the patient. Immediately prior to procedure a time out was called to verify the correct patient, procedure, equipment, support staff and site/side marked as required. Patient was prepped and draped in the usual sterile fashion.           Nettie Elm, DO

## 2022-10-31 ENCOUNTER — Telehealth (INDEPENDENT_AMBULATORY_CARE_PROVIDER_SITE_OTHER): Payer: Self-pay | Admitting: Internal Medicine

## 2022-10-31 NOTE — Telephone Encounter (Signed)
ok 

## 2022-10-31 NOTE — Telephone Encounter (Signed)
INSURANCE DENIED CT PELVIS WITH CONTRAST. I WILL SCAN DENIAL IN CHART.     PT CALLED ON FRIDAY AND TOLD LINDA B FOR [place] TO GO ON AND SCHEDULE IT AT [facility] AND She WOULD PAY OUT OF POCKET.     THANKS MELISSA W

## 2022-11-16 ENCOUNTER — Encounter (INDEPENDENT_AMBULATORY_CARE_PROVIDER_SITE_OTHER): Payer: Self-pay | Admitting: Internal Medicine

## 2022-11-16 ENCOUNTER — Other Ambulatory Visit: Payer: Self-pay

## 2022-11-16 ENCOUNTER — Ambulatory Visit (INDEPENDENT_AMBULATORY_CARE_PROVIDER_SITE_OTHER): Payer: BC Managed Care – PPO | Admitting: Internal Medicine

## 2022-11-16 VITALS — BP 144/99 | HR 115 | Resp 18 | Ht 70.0 in | Wt 167.0 lb

## 2022-11-16 DIAGNOSIS — M5416 Radiculopathy, lumbar region: Secondary | ICD-10-CM | POA: Insufficient documentation

## 2022-11-16 DIAGNOSIS — M25552 Pain in left hip: Secondary | ICD-10-CM

## 2022-11-16 DIAGNOSIS — E559 Vitamin D deficiency, unspecified: Secondary | ICD-10-CM

## 2022-11-16 DIAGNOSIS — R102 Pelvic and perineal pain: Secondary | ICD-10-CM

## 2022-11-16 DIAGNOSIS — E538 Deficiency of other specified B group vitamins: Secondary | ICD-10-CM

## 2022-11-16 DIAGNOSIS — R5382 Chronic fatigue, unspecified: Secondary | ICD-10-CM

## 2022-11-16 DIAGNOSIS — Z13220 Encounter for screening for lipoid disorders: Secondary | ICD-10-CM

## 2022-11-16 DIAGNOSIS — R29898 Other symptoms and signs involving the musculoskeletal system: Secondary | ICD-10-CM | POA: Insufficient documentation

## 2022-11-16 DIAGNOSIS — L280 Lichen simplex chronicus: Secondary | ICD-10-CM

## 2022-11-16 MED ORDER — GABAPENTIN 100 MG CAPSULE
100.0000 mg | ORAL_CAPSULE | Freq: Three times a day (TID) | ORAL | 2 refills | Status: DC
Start: 2022-11-16 — End: 2023-08-23

## 2022-11-16 MED ORDER — HALOBETASOL PROPIONATE 0.05 % TOPICAL OINTMENT
TOPICAL_OINTMENT | Freq: Two times a day (BID) | CUTANEOUS | 2 refills | Status: DC
Start: 2022-11-16 — End: 2023-11-22

## 2022-11-16 NOTE — Progress Notes (Signed)
INTERNAL [facility]ICINE, [facility]  [address]  [place] [id number]       Name: Stephanie Gallegos MRN:  [id number]   Date: 11/16/2022 Age: 57 y.o.       Chief Complaint:    Chief Complaint   Patient presents with    Follow Up     For chronic disease management.    Hip Pain     Lt         HPI:  Stephanie Gallegos is a 57 y.o. female who presents to the office today for follow-up.  Patient is actually doing relatively well and except for minor issues as described below which I have addressed in total with the patient.        Past Medical History:  History reviewed. No pertinent past medical history.  Past Medical History was reviewed and is negative for below    History reviewed. No pertinent surgical history.   Current Outpatient Medications   Medication Sig    buPROPion (WELLBUTRIN SR) 100 mg Oral tablet sustained-release 12 hr Take 1 Tablet (100 mg total) by mouth Twice daily    cyanocobalamin (VITAMIN B12) 1,000 mcg/mL Injection Solution Inject 1 mL (1,000 mcg total) into the muscle Every 30 days    cyclobenzaprine (FLEXERIL) 10 mg Oral Tablet Take 1 Tablet (10 mg total) by mouth Every 8 hours as needed for Muscle spasms    gabapentin (NEURONTIN) 100 mg Oral Capsule Take 1 Capsule (100 mg total) by mouth Three times a day    Ibuprofen (MOTRIN) 800 mg Oral Tablet Take 1 Tablet (800 mg total) by mouth Four times a day as needed for Pain for up to 180 days    sertraline (ZOLOFT) 100 mg Oral Tablet TAKE 1 & 1/2 (ONE & ONE-HALF) TABLETS BY MOUTH ONCE DAILY    traMADoL (ULTRAM) 50 mg Oral Tablet Take 1-2 Tablets (50-100 mg total) by mouth Every 6 hours as needed for Pain for up to 30 days     No Known Allergies    Family History:  Family Medical History:    None           Social History:   Social History     Tobacco Use   Smoking Status Every Day    Types: Cigarettes    Passive exposure: Never   Smokeless Tobacco Never     Social History     Substance and Sexual Activity   Alcohol Use Never     Social  History     Occupational History    Not on file       Review of Systems:  Review of systems as discussed in HPI    Problem List:  Patient Active Problem List   Diagnosis    Pelvic pain    Pain of left hip    Acute left lumbar radiculopathy    Left leg weakness       Physical Examination:  BP (!) 144/99 (Site: Left, Patient Position: Sitting, Cuff Size: Adult)   Pulse (!) 115   Resp 18   Ht 1.778 m (5\' 10" )   Wt 75.8 kg (167 lb)   LMP  (LMP Unknown)   SpO2 98%   BMI 23.96 kg/m       Physical Exam  Vitals and nursing note reviewed.   Constitutional:       General: She is not in acute distress.     Appearance: Normal appearance.   HENT:  Head: Normocephalic and atraumatic.      Right Ear: Tympanic membrane normal.      Left Ear: Tympanic membrane normal.      Nose: Nose normal.      Mouth/Throat:      Mouth: Mucous membranes are moist.      Pharynx: Oropharynx is clear.   Eyes:      General:         Right eye: No discharge.         Left eye: No discharge.      Conjunctiva/sclera: Conjunctivae normal.      Pupils: Pupils are equal, round, and reactive to light.   Cardiovascular:      Rate and Rhythm: Normal rate and regular rhythm.      Pulses:           Carotid pulses are 0 on the right side and 0 on the left side.       Popliteal pulses are 2+ on the right side and 2+ on the left side.        Dorsalis pedis pulses are 2+ on the right side and 2+ on the left side.      Heart sounds: Normal heart sounds.   Pulmonary:      Effort: Pulmonary effort is normal.      Breath sounds: Normal breath sounds. No wheezing, rhonchi or rales.   Abdominal:      General: Bowel sounds are normal. There is no distension.      Palpations: Abdomen is soft.      Tenderness: There is no abdominal tenderness. There is no rebound.   Musculoskeletal:         General: No signs of injury.      Cervical back: Normal range of motion. No tenderness.      Left hip: Deformity, tenderness and bony tenderness present. Decreased range of  motion. Decreased strength.   Lymphadenopathy:      Cervical: No cervical adenopathy.   Skin:     General: Skin is warm.      Capillary Refill: Capillary refill takes less than 2 seconds.      Coloration: Skin is not jaundiced.   Neurological:      General: No focal deficit present.      Mental Status: She is alert and oriented to person, place, and time.      Cranial Nerves: No cranial nerve deficit.      Motor: Weakness present.      Gait: Gait abnormal.      Deep Tendon Reflexes:      Reflex Scores:       Patellar reflexes are 3+ on the left side.       Achilles reflexes are 3+ on the left side.  Psychiatric:         Behavior: Behavior normal.              Health Maintenance:  Health Maintenance   Topic Date Due    Colonoscopy  Never done    Mammography  Never done    Influenza Vaccine (1) Never done    Meningococcal Vaccine  Aged Out    Adult Tdap-Td  Discontinued    Depression Screening  Discontinued    Hepatitis B Vaccine  Discontinued    Pap smear  Discontinued    HIV Screening  Discontinued    Shingles Vaccine  Discontinued    Covid-19 Vaccine  Discontinued    Pneumococcal Vaccine, Age 71-64  Discontinued  Assessment:      ICD-10-CM    1. Acute left lumbar radiculopathy  M54.16 Her physical exam is highly concerning in the fact that she is having persistent pain in her left hip but now she is noticing pain that is radiating from her left hip down into the anterior aspect of her lower leg and into the medial aspect of her foot.  The problem is his that she is now having weakness of her left lower extremity which she has not had in the past and there is a noticeable difference in the strength of left compared to right of the entire left lower extremity.  I am going to order a stat MRI in addition to placing her on gabapentin and we will make further arrangements depending    gabapentin (NEURONTIN) 100 mg Oral Capsule     MRI SPINE LUMBOSACRAL WO CONTRAST      2. Left leg weakness  R29.898 As above suspect  secondary to a L4 to L5 lesion on the left.    gabapentin (NEURONTIN) 100 mg Oral Capsule     MRI SPINE LUMBOSACRAL WO CONTRAST      3. Pain of left hip  M25.552 Secondary to lumbar radiculopathy.    gabapentin (NEURONTIN) 100 mg Oral Capsule     MRI SPINE LUMBOSACRAL WO CONTRAST      4. Pelvic pain  R10.2 Improve with tramadol but most likely exacerbated by above         Orders Placed This Encounter    MRI SPINE LUMBOSACRAL WO CONTRAST    gabapentin (NEURONTIN) 100 mg Oral Capsule           Follow up:  Return in about 1 month (around 12/17/2022).    This note was partially created using voice recognition software and is inherently subject to errors including those of syntax and "sound alike " substitutions which may escape proof reading.  In such instances, original meaning may be extrapolated by contextual derivation.    Lucia Gaskins, DO  11/16/2022, 08:16

## 2022-11-16 NOTE — Addendum Note (Signed)
Addended byTamala Julian, Jayleon Mcfarlane on: 11/16/2022 03:09 PM     Modules accepted: Orders

## 2022-11-21 ENCOUNTER — Other Ambulatory Visit (INDEPENDENT_AMBULATORY_CARE_PROVIDER_SITE_OTHER): Payer: Self-pay | Admitting: Internal Medicine

## 2022-11-21 ENCOUNTER — Encounter (INDEPENDENT_AMBULATORY_CARE_PROVIDER_SITE_OTHER): Payer: Self-pay | Admitting: Internal Medicine

## 2022-11-21 MED ORDER — TRAMADOL 50 MG TABLET
1.0000 | ORAL_TABLET | Freq: Four times a day (QID) | ORAL | 0 refills | Status: DC | PRN
Start: 2022-11-21 — End: 2023-01-15

## 2022-11-29 ENCOUNTER — Other Ambulatory Visit (INDEPENDENT_AMBULATORY_CARE_PROVIDER_SITE_OTHER): Payer: Self-pay | Admitting: Internal Medicine

## 2022-11-29 ENCOUNTER — Telehealth (INDEPENDENT_AMBULATORY_CARE_PROVIDER_SITE_OTHER): Payer: Self-pay | Admitting: Internal Medicine

## 2022-11-29 ENCOUNTER — Encounter (INDEPENDENT_AMBULATORY_CARE_PROVIDER_SITE_OTHER): Payer: Self-pay | Admitting: Internal Medicine

## 2022-11-29 DIAGNOSIS — M25552 Pain in left hip: Secondary | ICD-10-CM

## 2022-11-29 DIAGNOSIS — R937 Abnormal findings on diagnostic imaging of other parts of musculoskeletal system: Secondary | ICD-10-CM

## 2022-11-29 DIAGNOSIS — M5136 Other intervertebral disc degeneration, lumbar region: Secondary | ICD-10-CM

## 2022-11-29 DIAGNOSIS — M5416 Radiculopathy, lumbar region: Secondary | ICD-10-CM

## 2022-11-29 DIAGNOSIS — R29898 Other symptoms and signs involving the musculoskeletal system: Secondary | ICD-10-CM

## 2022-11-29 IMAGING — MR MRI LUMBAR SPINE WITHOUT CONTRAST
4 of 7 series · 25 of 48 positions shown · IV contrast (gadolinium)
Comparison: Lumbar spine x-ray dated 10/13/2022.

﻿EXAM:  [id number]   MRI LUMBAR SPINE WITHOUT CONTRAST
INDICATION: Back pain. Left hip pain.  Trauma due to fall.  No back surgery or malignancy.
TECHNIQUE: Multiplanar, multisequential MRI of the lumbosacral spine was performed without gadolinium contrast.

[Series 5: T2 · sagittal · 4.0mm · 0.94mm/px · 5 of 13 slices shown (1 of 4)]
[im 1/13]
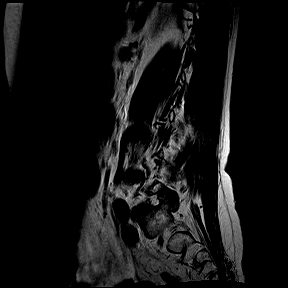
[im 4/13]
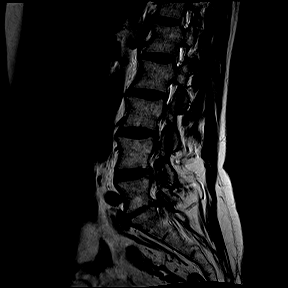
[im 7/13]
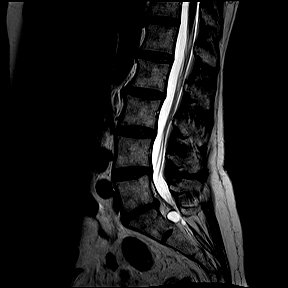
[im 10/13]
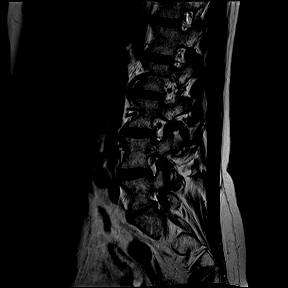
[im 13/13]
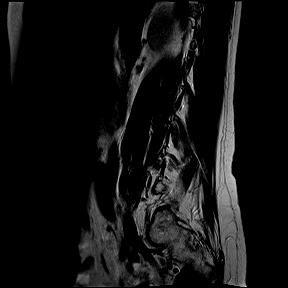

[Series 8: T2 · coronal · 5.0mm · 0.82mm/px · 7 of 18 slices shown (2 of 4)]
[im 1/18]
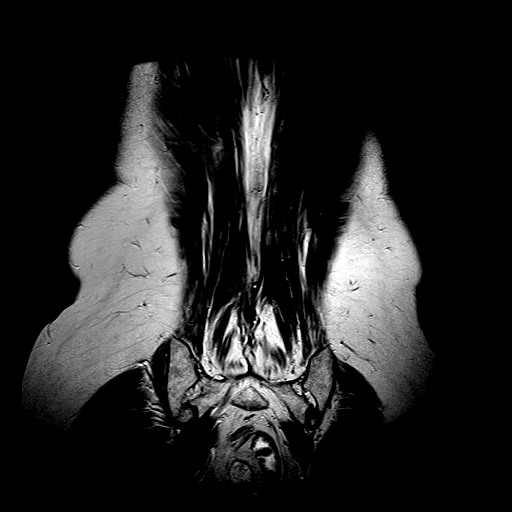
[im 3/18]
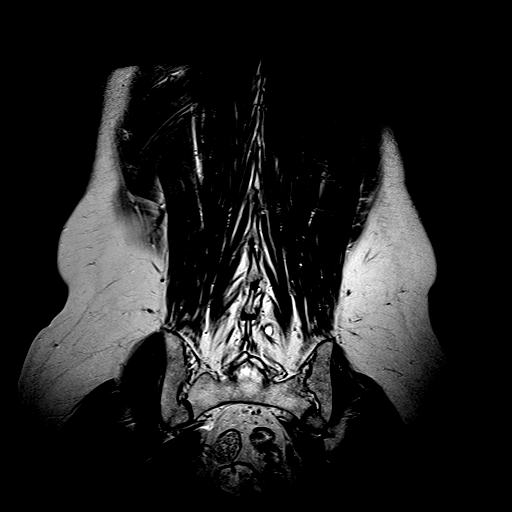
[im 6/18]
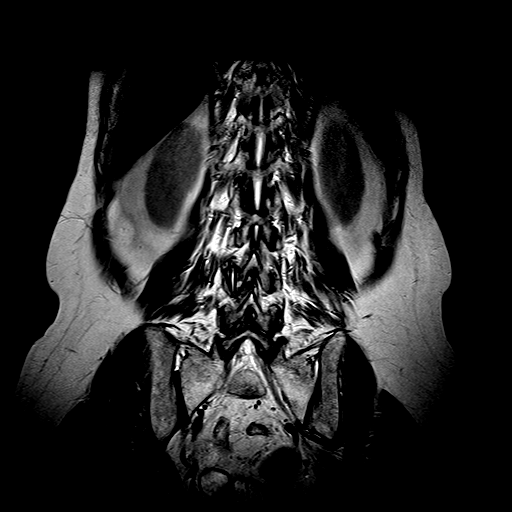
[im 9/18]
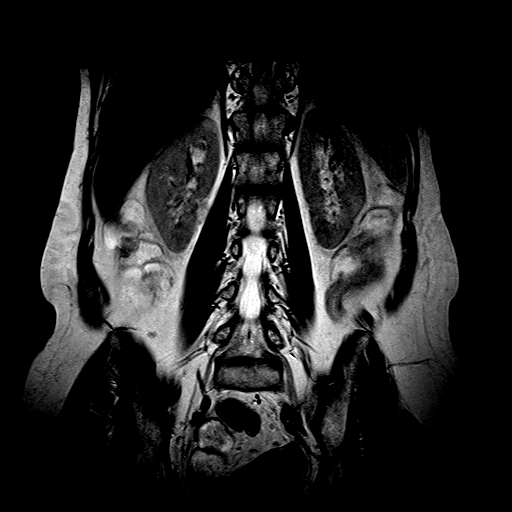
[im 12/18]
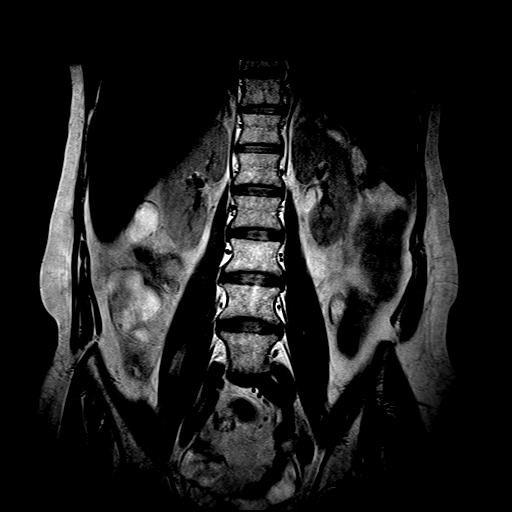
[im 15/18]
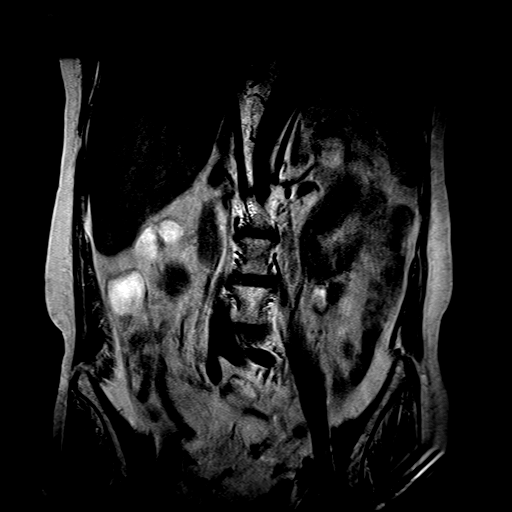
[im 18/18]
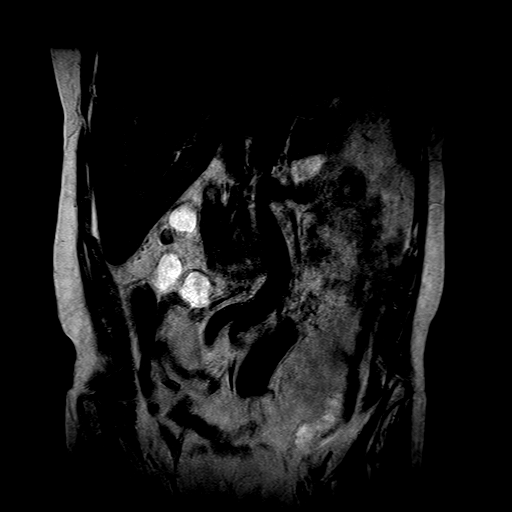

[Series 10: T2 · axial · 4.0mm · 0.52mm/px · z∈[-139,+74]mm · 9 of 24 slices shown (3 of 4)]
[im 1/24]
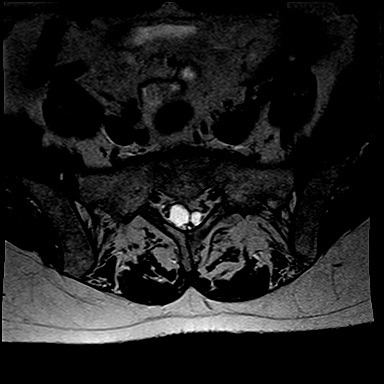
[im 3/24]
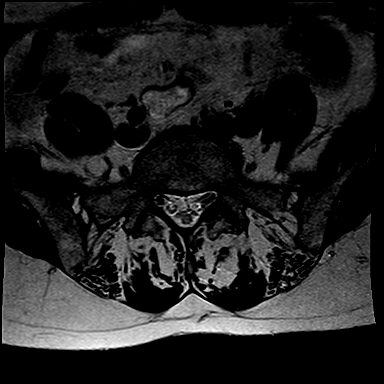
[im 6/24]
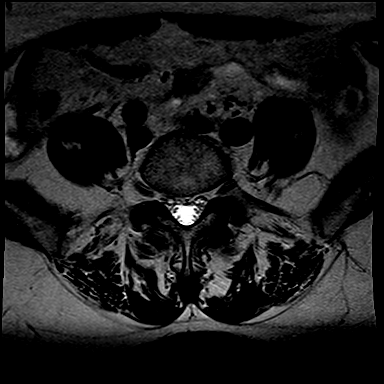
[im 9/24]
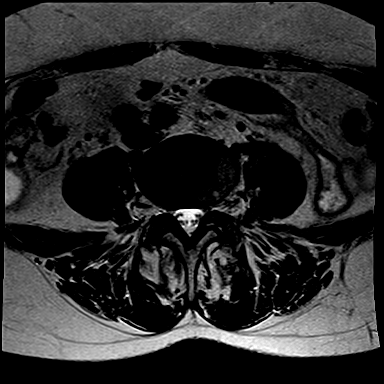
[im 12/24]
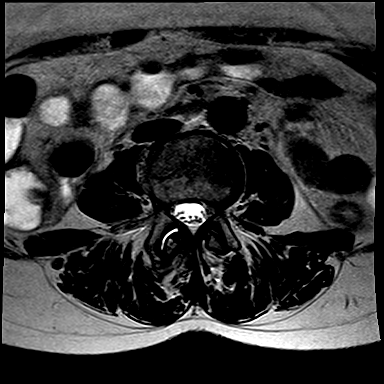
[im 15/24]
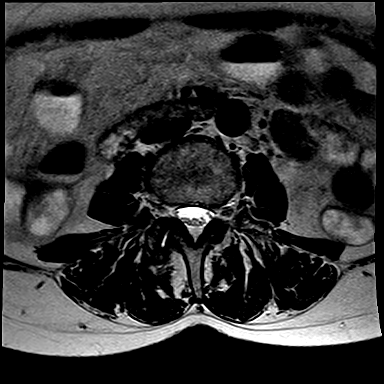
[im 18/24]
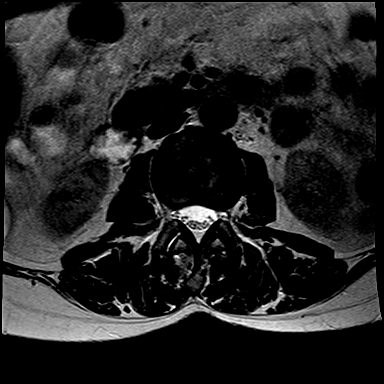
[im 21/24]
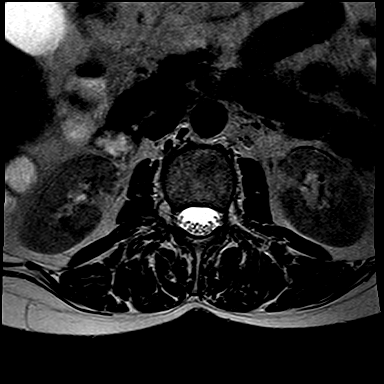
[im 24/24]
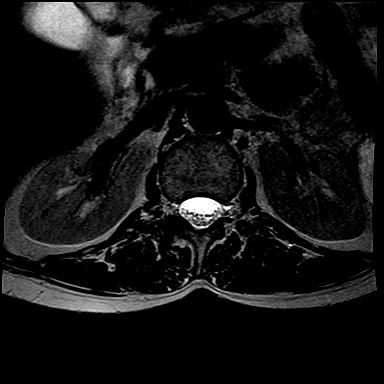

[Series 11: T2 · oblique · 5.0mm · 0.47mm/px · 4 of 18 slices shown (4 of 4)]
[im 1/18]
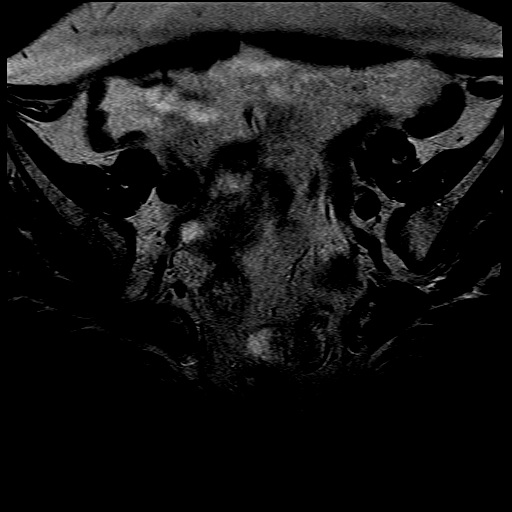
[im 3/18]
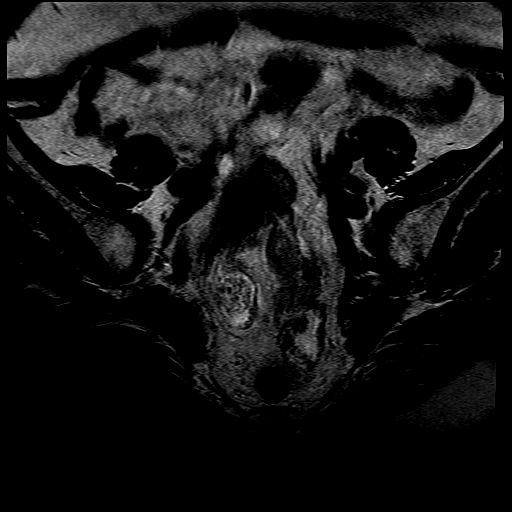
[im 9/18]
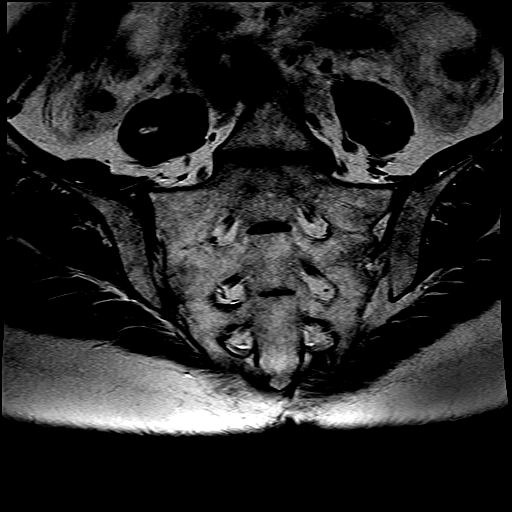
[im 15/18]
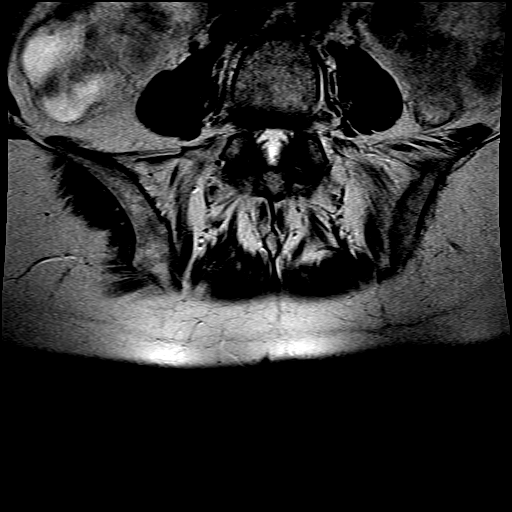

[25 of 48 positions shown; findings below may reference images not displayed]

FINDINGS: No acute bony lesions of lumbar vertebrae. Lower spinal cord and cauda equina are normal. 

At L1-2 level, no focal disc lesions are seen.

At L2-3 level, mild compromise of both lateral recesses due to degenerative disc changes and facet arthropathy. 

At L3-4 level, significant facet arthropathy, right worse than the left. Moderate degenerative disc disease with annulus tear and small disc protrusion extending towards superior L4, at the level of left lateral recess and left neural foramen.  Combination causes significant left foraminal lateral recess narrowing and moderate compromise of right neural foramen at this level.

At L4-5 level, significant bilateral facet arthropathy with hypertrophy of ligaments and bulging annulus due to degenerative disc changes are causing moderately significant compromise of both lateral recesses and neural foramina and moderate compromise of thecal sac with AP diameter measuring 9.2 mm. 

At L5-S1 level, significant loss of disc space with degenerative disc disease is noted with 6 mm sized disc protrusion on the left side at the level of left lateral recess and neural foramen significantly compromising the left lateral recess and left neural foramen, impinging on the left L5 and S1 nerve roots.

Paravertebral soft tissues do not show acute findings.
IMPRESSION: 1. No acute bone changes of lumbar vertebrae.

2. At L5-S1 level, significant loss of disc space with degenerative disc disease is noted with 6 mm sized disc protrusion on the left side at the level of left lateral recess and neural foramen significantly compromising the left lateral recess and left neural foramen, impinging on the left L5 and S1 nerve roots.

3. At L3-4 level, significant facet arthropathy, right worse than the left. Moderate degenerative disc disease with annulus tear and small disc protrusion extending towards superior L4, at the level of left lateral recess and left neural foramina.  Combination causes significant left foraminal lateral recess narrowing and moderate compromise of right neural foramen at this level.

4. At L4-5 level, significant bilateral facet arthropathy with hypertrophy of ligaments and bulging annulus due to degenerative disc changes are causing moderately significant compromise of both lateral recess and neural foramina and moderate compromise of thecal sac with AP diameter measuring 9.2 mm. 

5. Findings at other disc levels are described above in detail.

## 2022-11-29 NOTE — Telephone Encounter (Signed)
Requesting a call from a nurse about her mri results and the neurosurgeon.

## 2022-12-01 ENCOUNTER — Other Ambulatory Visit: Payer: Self-pay

## 2022-12-01 ENCOUNTER — Emergency Department
Admission: EM | Admit: 2022-12-01 | Discharge: 2022-12-02 | Disposition: A | Discharge status: Home / Self Care | Payer: BC Managed Care – PPO | Attending: Family | Admitting: Family

## 2022-12-01 DIAGNOSIS — S61051A Open bite of right thumb without damage to nail, initial encounter: Secondary | ICD-10-CM | POA: Insufficient documentation

## 2022-12-01 DIAGNOSIS — W5501XA Bitten by cat, initial encounter: Secondary | ICD-10-CM

## 2022-12-01 DIAGNOSIS — L03011 Cellulitis of right finger: Secondary | ICD-10-CM | POA: Insufficient documentation

## 2022-12-01 DIAGNOSIS — L0390 Cellulitis, unspecified: Secondary | ICD-10-CM

## 2022-12-01 DIAGNOSIS — S61031A Puncture wound without foreign body of right thumb without damage to nail, initial encounter: Secondary | ICD-10-CM

## 2022-12-01 LAB — COMPREHENSIVE METABOLIC PANEL, NON-FASTING
ALBUMIN/GLOBULIN RATIO: 1.5 — ABNORMAL HIGH (ref 0.8–1.4)
ALBUMIN: 4.9 g/dL (ref 3.5–5.7)
ALKALINE PHOSPHATASE: 89 U/L (ref 34–104)
ALT (SGPT): 25 U/L (ref 7–52)
ANION GAP: 11 mmol/L (ref 4–13)
AST (SGOT): 27 U/L (ref 13–39)
BILIRUBIN TOTAL: 0.4 mg/dL (ref 0.3–1.2)
BUN/CREA RATIO: 9 (ref 6–22)
BUN: 7 mg/dL (ref 7–25)
CALCIUM, CORRECTED: 8.9 mg/dL (ref 8.9–10.8)
CALCIUM: 9.6 mg/dL (ref 8.6–10.3)
CHLORIDE: 103 mmol/L (ref 98–107)
CO2 TOTAL: 19 mmol/L — ABNORMAL LOW (ref 21–31)
CREATININE: 0.76 mg/dL (ref 0.60–1.30)
ESTIMATED GFR: 93 mL/min/{1.73_m2} (ref >59)
GLOBULIN: 3.2 (ref 2.9–5.4)
GLUCOSE: 107 mg/dL (ref 74–109)
OSMOLALITY, CALCULATED: 265 mOsm/kg — ABNORMAL LOW (ref 270–290)
POTASSIUM: 3.3 mmol/L — ABNORMAL LOW (ref 3.5–5.1)
PROTEIN TOTAL: 8.1 g/dL (ref 6.4–8.9)
SODIUM: 133 mmol/L — ABNORMAL LOW (ref 136–145)

## 2022-12-01 LAB — CBC WITH DIFF
BASOPHIL #: 0 10*3/uL (ref 0.00–0.10)
BASOPHIL %: 0 % (ref 0–1)
EOSINOPHIL #: 0.1 10*3/uL (ref 0.00–0.50)
EOSINOPHIL %: 2 %
HCT: 37.8 % (ref 31.2–41.9)
HGB: 13.2 g/dL (ref 10.9–14.3)
LYMPHOCYTE #: 1 10*3/uL (ref 1.00–3.00)
LYMPHOCYTE %: 11 % — ABNORMAL LOW (ref 16–44)
MCH: 34.2 pg — ABNORMAL HIGH (ref 24.7–32.8)
MCHC: 34.9 g/dL (ref 32.3–35.6)
MCV: 98.1 fL — ABNORMAL HIGH (ref 75.5–95.3)
MONOCYTE #: 0.7 10*3/uL (ref 0.30–1.00)
MONOCYTE %: 8 % (ref 5–13)
MPV: 6.4 fL — ABNORMAL LOW (ref 7.9–10.8)
NEUTROPHIL #: 7.3 10*3/uL (ref 1.85–7.80)
NEUTROPHIL %: 79 % — ABNORMAL HIGH (ref 43–77)
PLATELETS: 254 10*3/uL (ref 140–440)
RBC: 3.85 10*6/uL (ref 3.63–4.92)
RDW: 14.4 % (ref 12.3–17.7)
WBC: 9.2 10*3/uL (ref 3.8–11.8)

## 2022-12-01 LAB — C-REACTIVE PROTEIN (CRP): C-REACTIVE PROTEIN (CRP): 1.3 mg/dL — ABNORMAL HIGH (ref 0.1–0.5)

## 2022-12-01 MED ORDER — SODIUM CHLORIDE 0.9 % INTRAVENOUS PIGGYBACK
INJECTION | INTRAVENOUS | Status: AC
Start: 2022-12-01 — End: 2022-12-01
  Filled 2022-12-01: qty 100

## 2022-12-01 MED ORDER — SODIUM CHLORIDE 0.9 % (FLUSH) INJECTION SYRINGE
3.0000 mL | INJECTION | Freq: Three times a day (TID) | INTRAMUSCULAR | Status: DC
Start: 2022-12-02 — End: 2022-12-02
  Administered 2022-12-02: 3 mL

## 2022-12-01 MED ORDER — SODIUM CHLORIDE 0.9 % (FLUSH) INJECTION SYRINGE
3.0000 mL | INJECTION | INTRAMUSCULAR | Status: DC | PRN
Start: 2022-12-01 — End: 2022-12-02

## 2022-12-01 MED ORDER — AMPICILLIN-SULBACTAM 3 GRAM SOLUTION FOR INJECTION
INTRAMUSCULAR | Status: AC
Start: 2022-12-01 — End: 2022-12-01
  Filled 2022-12-01: qty 8

## 2022-12-01 MED ORDER — SODIUM CHLORIDE 0.9 % INTRAVENOUS PIGGYBACK
3.0000 g | INTRAVENOUS | Status: AC
Start: 2022-12-02 — End: 2022-12-02
  Administered 2022-12-02: 0 g via INTRAVENOUS
  Administered 2022-12-02: 3 g via INTRAVENOUS

## 2022-12-01 NOTE — ED APP Handoff Note (Signed)
[name]  Emergency Department  Provider in Triage Note    Name: Stephanie Gallegos  Age: 57 y.o.  Gender: female     Subjective:   Stephanie Gallegos is a 57 y.o. female who presents with complaint of Cat bite  .  Patient was bitten by her indoor cat last night.  All vaccines are up-to-date.  Rates her right hand pain 4/10.  No fever chills nausea or vomiting.   Objective:   Filed Vitals:    12/01/22 2203   BP: (!) 139/112   Pulse: (!) 110   Resp: 18   SpO2: 99%      Focused Physical Exam shows awake alert and oriented x3.  Respirations even and nonlabored.  Right thumb erythematous tender.  No drainage.    Assessment:  A medical screening exam was completed.  This patient is a 57 y.o. female with initial findings showing cat bite    Plan:  Please see initial orders and work-up below.  This is to be continued with full evaluation in the main Emergency Department.     No current facility-administered medications for this encounter.     Results for orders placed or performed during the hospital encounter of 12/01/22 (from the past 24 hour(s))   CBC/DIFF    Narrative    The following orders were created for panel order CBC/DIFF.  Procedure                               Abnormality         Status                     ---------                               -----------         ------                     CBC WITH XYIA[[id number]]                                                                 Please view results for these tests on the individual orders.        Boris Lown, APRN  12/01/2022, 22:01

## 2022-12-01 NOTE — ED Triage Notes (Signed)
Cat bit her right thumb last night, swelling to thumb/hand/wrist.

## 2022-12-01 NOTE — ED Provider Notes (Signed)
[facility]  ED Primary Provider Note  History of Present Illness   Chief Complaint   Patient presents with    Cat bite     Stephanie Gallegos is a 57 y.o. female who had concerns including Cat bite.  Arrival: The patient arrived by Car    Patient is a 57 year old female Gallegos significant past medical history presents emergency department with complaints of swelling of the right thumb.  She states last night she was bit by her own cat.  She states the swelling has gotten worse.  She endorses some pain at the distal part of her thumb.  Denies fevers chills nausea vomiting.  She states the cat is up-to-date on its shots has been acting normally her tetanus is up-to-date      History Reviewed This Encounter: Medical History  Surgical History  Family History  Social History    Physical Exam   ED Triage Vitals [12/01/22 2203]   BP (Non-Invasive) (!) 139/112   Heart Rate (!) 110   Respiratory Rate 18   Temp    SpO2 99 %   Weight 72.6 kg (160 lb)   Height 1.778 m (5\' 10" )     Physical Exam  Constitutional:       Appearance: Normal appearance.   HENT:      Head: Normocephalic.      Nose: Nose normal.      Mouth/Throat:      Mouth: Mucous membranes are moist.   Eyes:      Extraocular Movements: Extraocular movements intact.      Pupils: Pupils are equal, round, and reactive to light.   Cardiovascular:      Rate and Rhythm: Normal rate and regular rhythm.      Pulses: Normal pulses.      Heart sounds: Normal heart sounds.   Pulmonary:      Effort: Pulmonary effort is normal.      Breath sounds: Normal breath sounds.   Abdominal:      General: Abdomen is flat.      Palpations: Abdomen is soft.   Musculoskeletal:      Cervical back: Normal range of motion and neck supple.      Comments: Swelling of the distal phalanx of the right 1st digit there is puncture wounds overlying the volar aspect he has Gallegos tenderness along the tendon sheath patient is able to flex the thumb without pain.   Neurological:       Mental Status: She is alert.       Patient Data     Labs Ordered/Reviewed   COMPREHENSIVE METABOLIC PANEL, NON-FASTING - Abnormal; Notable for the following components:       Result Value    SODIUM 133 (*)     POTASSIUM 3.3 (*)     CO2 TOTAL 19 (*)     ALBUMIN/GLOBULIN RATIO 1.5 (*)     OSMOLALITY, CALCULATED 265 (*)     All other components within normal limits    Narrative:     Estimated Glomerular Filtration Rate (eGFR) is calculated using the CKD-EPI (2021) equation, intended for patients 61 years of age and older. If gender is not documented or "unknown", there will be Gallegos eGFR calculation.     C-REACTIVE PROTEIN (CRP) - Abnormal; Notable for the following components:    C-REACTIVE PROTEIN (CRP) 1.3 (*)     All other components within normal limits   CBC WITH DIFF - Abnormal; Notable for the  following components:    MCV 98.1 (*)     MCH 34.2 (*)     MPV 6.4 (*)     NEUTROPHIL % 79 (*)     LYMPHOCYTE % 11 (*)     All other components within normal limits   ADULT ROUTINE BLOOD CULTURE, SET OF 2 BOTTLES (BACTERIA AND YEAST)   ADULT ROUTINE BLOOD CULTURE, SET OF 2 BOTTLES (BACTERIA AND YEAST)   CBC/DIFF    Narrative:     The following orders were created for panel order CBC/DIFF.  Procedure                               Abnormality         Status                     ---------                               -----------         ------                     CBC WITH ZESP[[id number]]                Abnormal            Final result                 Please view results for these tests on the individual orders.     Gallegos orders to display     Medical Decision Making        Medical Decision Making  Patient appears to have cellulitis secondary to cat bite Gallegos clinical signs of flexor tenosynovitis at this time.  Her tetanus is up-to-date.  She was given Unasyn IV here in the ED and written a prescription for Augmentin.  She was instructed on the importance of close follow up with the primary care and very low threshold to return  to the ED if any worsening or new symptoms    Risk  Prescription drug management.             Medications Administered in the ED   NS flush syringe (has Gallegos administration in time range)   NS flush syringe (has Gallegos administration in time range)   ampicillin-sulbactam (UNASYN) 3 g in NS 100 mL IVPB minibag (has Gallegos administration in time range)     Clinical Impression   Cellulitis (Primary)   Cat bite       Disposition: Discharged

## 2022-12-02 ENCOUNTER — Encounter (HOSPITAL_COMMUNITY): Payer: Self-pay | Admitting: Physician Assistant

## 2022-12-02 MED ORDER — AMOXICILLIN 875 MG-POTASSIUM CLAVULANATE 125 MG TABLET
1.0000 | ORAL_TABLET | Freq: Two times a day (BID) | ORAL | 0 refills | Status: AC
Start: 2022-12-02 — End: 2022-12-12

## 2022-12-02 NOTE — ED Nurses Note (Signed)
Discharge instructions to patient verbalized understanding all questions answered ambulated from er without difficulty.

## 2022-12-02 NOTE — Discharge Instructions (Addendum)
A prescription for antibiotics was sent to your pharmacy get this filled take as prescribed it is very important follow up closely with the primary care.  If you have new or worsening symptoms return to the emergency department immediately

## 2022-12-06 LAB — ADULT ROUTINE BLOOD CULTURE, SET OF 2 BOTTLES (BACTERIA AND YEAST)
BLOOD CULTURE, ROUTINE: NO GROWTH
BLOOD CULTURE, ROUTINE: NO GROWTH

## 2023-01-15 ENCOUNTER — Encounter (INDEPENDENT_AMBULATORY_CARE_PROVIDER_SITE_OTHER): Payer: Self-pay | Admitting: Internal Medicine

## 2023-01-15 ENCOUNTER — Other Ambulatory Visit (INDEPENDENT_AMBULATORY_CARE_PROVIDER_SITE_OTHER): Payer: Self-pay | Admitting: Internal Medicine

## 2023-01-15 MED ORDER — TRAMADOL 50 MG TABLET
1.0000 | ORAL_TABLET | Freq: Four times a day (QID) | ORAL | 0 refills | Status: AC | PRN
Start: 2023-01-15 — End: 2023-02-14

## 2023-04-23 ENCOUNTER — Encounter (INDEPENDENT_AMBULATORY_CARE_PROVIDER_SITE_OTHER): Payer: Self-pay | Admitting: Internal Medicine

## 2023-04-23 ENCOUNTER — Other Ambulatory Visit (INDEPENDENT_AMBULATORY_CARE_PROVIDER_SITE_OTHER): Payer: Self-pay | Admitting: Internal Medicine

## 2023-04-23 MED ORDER — TRAMADOL 50 MG TABLET
1.0000 | ORAL_TABLET | Freq: Four times a day (QID) | ORAL | 0 refills | Status: DC | PRN
Start: 2023-04-23 — End: 2023-06-29

## 2023-06-29 ENCOUNTER — Encounter (INDEPENDENT_AMBULATORY_CARE_PROVIDER_SITE_OTHER): Payer: Self-pay | Admitting: Internal Medicine

## 2023-06-29 ENCOUNTER — Other Ambulatory Visit (INDEPENDENT_AMBULATORY_CARE_PROVIDER_SITE_OTHER): Payer: Self-pay | Admitting: Internal Medicine

## 2023-06-29 MED ORDER — TRAMADOL 50 MG TABLET
1.0000 | ORAL_TABLET | Freq: Four times a day (QID) | ORAL | 0 refills | Status: DC | PRN
Start: 2023-06-29 — End: 2023-06-29

## 2023-06-29 MED ORDER — TRAMADOL 50 MG TABLET
1.0000 | ORAL_TABLET | Freq: Four times a day (QID) | ORAL | 0 refills | Status: DC | PRN
Start: 2023-06-29 — End: 2023-08-08

## 2023-07-10 ENCOUNTER — Other Ambulatory Visit (INDEPENDENT_AMBULATORY_CARE_PROVIDER_SITE_OTHER): Payer: Self-pay | Admitting: Internal Medicine

## 2023-07-10 ENCOUNTER — Encounter (INDEPENDENT_AMBULATORY_CARE_PROVIDER_SITE_OTHER): Payer: Self-pay | Admitting: Internal Medicine

## 2023-07-10 DIAGNOSIS — F320 Major depressive disorder, single episode, mild: Secondary | ICD-10-CM

## 2023-07-10 MED ORDER — IBUPROFEN 800 MG TABLET
800.0000 mg | ORAL_TABLET | Freq: Three times a day (TID) | ORAL | 0 refills | Status: DC | PRN
Start: 2023-07-10 — End: 2023-08-23

## 2023-07-10 MED ORDER — BUPROPION HCL SR 100 MG TABLET,12 HR SUSTAINED-RELEASE
100.0000 mg | ORAL_TABLET | Freq: Two times a day (BID) | ORAL | 1 refills | Status: AC
Start: 2023-07-10 — End: ?

## 2023-07-23 ENCOUNTER — Other Ambulatory Visit: Payer: Self-pay

## 2023-07-23 ENCOUNTER — Encounter (INDEPENDENT_AMBULATORY_CARE_PROVIDER_SITE_OTHER): Payer: Self-pay | Admitting: Internal Medicine

## 2023-07-23 ENCOUNTER — Ambulatory Visit (INDEPENDENT_AMBULATORY_CARE_PROVIDER_SITE_OTHER): Payer: BC Managed Care – PPO | Admitting: Internal Medicine

## 2023-07-23 VITALS — BP 159/102 | HR 99 | Resp 16 | Ht 70.0 in | Wt 166.0 lb

## 2023-07-23 DIAGNOSIS — E538 Deficiency of other specified B group vitamins: Secondary | ICD-10-CM

## 2023-07-23 DIAGNOSIS — R29898 Other symptoms and signs involving the musculoskeletal system: Secondary | ICD-10-CM

## 2023-07-23 DIAGNOSIS — M5416 Radiculopathy, lumbar region: Secondary | ICD-10-CM | POA: Insufficient documentation

## 2023-07-23 DIAGNOSIS — Z79899 Other long term (current) drug therapy: Secondary | ICD-10-CM | POA: Insufficient documentation

## 2023-07-23 DIAGNOSIS — M25552 Pain in left hip: Secondary | ICD-10-CM

## 2023-07-23 MED ORDER — PREGABALIN 25 MG CAPSULE
25.0000 mg | ORAL_CAPSULE | Freq: Two times a day (BID) | ORAL | 2 refills | Status: DC
Start: 2023-07-23 — End: 2023-08-30

## 2023-07-23 NOTE — Progress Notes (Signed)
INTERNAL MEDICINE, [facility]  [address]  [place] [address]       Name: Stephanie Gallegos MRN:  [id number]   Date: 07/23/2023 Age: 57 y.o.       Chief Complaint:    Chief Complaint   Patient presents with    Follow Up     For chronic disease management.            HPI:  Stephanie Gallegos is a 57 y.o. female who presents to the office today for follow-up.  Patient is actually doing relatively well and except for minor issues as described below which I have addressed in total with the patient.        Past Medical History:  History reviewed. No pertinent past medical history.  Past Medical History was reviewed and is negative for below    History reviewed. No pertinent surgical history.   Current Outpatient Medications   Medication Sig    buPROPion (WELLBUTRIN SR) 100 mg Oral tablet sustained-release 12 hr Take 1 Tablet (100 mg total) by mouth Twice daily    cyanocobalamin (VITAMIN B12) 1,000 mcg/mL Injection Solution Inject 1 mL (1,000 mcg total) into the muscle Every 30 days    cyclobenzaprine (FLEXERIL) 10 mg Oral Tablet Take 1 Tablet (10 mg total) by mouth Every 8 hours as needed for Muscle spasms    gabapentin (NEURONTIN) 100 mg Oral Capsule Take 1 Capsule (100 mg total) by mouth Three times a day (Patient not taking: Reported on 07/23/2023)    Halobetasol Propionate (ULTRAVATE) 0.05 % Ointment Apply topically Twice daily    Ibuprofen (MOTRIN) 800 mg Oral Tablet Take 1 Tablet (800 mg total) by mouth Three times a day as needed for Pain    pregabalin (LYRICA) 25 mg Oral Capsule Take 1 Capsule (25 mg total) by mouth Twice daily    sertraline (ZOLOFT) 100 mg Oral Tablet TAKE 1 & 1/2 (ONE & ONE-HALF) TABLETS BY MOUTH ONCE DAILY    traMADoL (ULTRAM) 50 mg Oral Tablet Take 1-2 Tablets (50-100 mg total) by mouth Every 6 hours as needed for Pain     No Known Allergies    Family History:  Family Medical History:    None           Social History:   Social History     Tobacco Use   Smoking Status  Every Day    Types: Cigarettes    Passive exposure: Never   Smokeless Tobacco Never     Social History     Substance and Sexual Activity   Alcohol Use Never     Social History     Occupational History    Not on file       Review of Systems:  Review of systems as discussed in HPI    Problem List:  Patient Active Problem List   Diagnosis    Pelvic pain    Pain of left hip    Acute left lumbar radiculopathy    Left leg weakness    Lumbar radiculopathy, chronic    High risk medication use       Physical Examination:  BP (!) 159/102 (Site: Left Arm, Patient Position: Sitting, Cuff Size: Adult)   Pulse 99   Resp 16   Ht 1.778 m (5\' 10" )   Wt 75.3 kg (166 lb)   LMP  (LMP Unknown)   SpO2 97%   BMI 23.82 kg/m       Physical  Exam  Vitals and nursing note reviewed.   Constitutional:       General: She is not in acute distress.     Appearance: Normal appearance.   HENT:      Head: Normocephalic and atraumatic.      Right Ear: Tympanic membrane normal.      Left Ear: Tympanic membrane normal.      Nose: Nose normal.      Mouth/Throat:      Mouth: Mucous membranes are moist.      Pharynx: Oropharynx is clear.   Eyes:      General:         Right eye: No discharge.         Left eye: No discharge.      Conjunctiva/sclera: Conjunctivae normal.      Pupils: Pupils are equal, round, and reactive to light.   Cardiovascular:      Rate and Rhythm: Normal rate and regular rhythm.      Pulses:           Carotid pulses are 0 on the right side and 0 on the left side.       Popliteal pulses are 2+ on the right side and 2+ on the left side.        Dorsalis pedis pulses are 2+ on the right side and 2+ on the left side.      Heart sounds: Normal heart sounds.   Pulmonary:      Effort: Pulmonary effort is normal.      Breath sounds: Normal breath sounds. No wheezing, rhonchi or rales.   Abdominal:      General: Bowel sounds are normal. There is no distension.      Palpations: Abdomen is soft.      Tenderness: There is abdominal tenderness  in the left lower quadrant. There is guarding. There is no rebound. Positive signs include obturator sign.   Musculoskeletal:         General: No signs of injury.      Cervical back: Normal range of motion. No tenderness.      Left hip: Deformity, tenderness and bony tenderness present. Decreased range of motion. Decreased strength.   Lymphadenopathy:      Cervical: No cervical adenopathy.   Skin:     General: Skin is warm.      Capillary Refill: Capillary refill takes less than 2 seconds.      Coloration: Skin is not jaundiced.   Neurological:      General: No focal deficit present.      Mental Status: She is alert and oriented to person, place, and time.      Cranial Nerves: No cranial nerve deficit.      Motor: No weakness.   Psychiatric:         Behavior: Behavior normal.              Health Maintenance:  Health Maintenance   Topic Date Due    NonMedicare Preventative Exam  08/05/2023 (Originally 10-23-1966)    Influenza Vaccine (1) 08/22/2023 (Originally 07/01/2023)    Colonoscopy  01/20/2024 (Originally 05/30/2013)    Breast Cancer Screening  11/24/2023    Adult Tdap-Td  Discontinued    Hepatitis B Vaccine  Discontinued    Hepatitis C screening  Discontinued    Pap smear/HPV  Discontinued    HIV Screening  Discontinued    Shingles Vaccine  Discontinued    Covid-19 Vaccine  Discontinued  Pneumococcal Vaccine, Age 74-64  Discontinued        Assessment:      ICD-10-CM    1. Lumbar radiculopathy, chronic  M54.16 The patient's condition is unchanged from prior.  We have discussed whether there is need for any therapeutic changes and the patient states that they are tolerating current treatments and will just continue with current lifestyle.     pregabalin (LYRICA) 25 mg Oral Capsule      2. High risk medication use  Z79.899 DRUG SCREEN, NO CONFIRMATION, URINE      3. Pain of left hip  M25.552 The patient has had improvements in their symptoms with current treatment plans as described above.  At this point after a  lengthy discussion the patient feels that they are stable to a reasonable extent and no further treatments at this point are required.  We will obviously re-evaluate this on their next visit and I have informed them that if there is any worsening in their symptoms to call the office early for further treatment options.       4. Left leg weakness  R29.898 The patient's condition is unchanged from prior.  We have discussed whether there is need for any therapeutic changes and the patient states that they are tolerating current treatments and will just continue with current lifestyle.       5. Vitamin B12 deficiency  E53.8 Condition stable will continue current therapy.           Orders Placed This Encounter    DRUG SCREEN, NO CONFIRMATION, URINE    pregabalin (LYRICA) 25 mg Oral Capsule     There are no discontinued medications.         Follow up:  Return in about 4 months (around 11/22/2023).    This note was partially created using voice recognition software and is inherently subject to errors including those of syntax and "sound alike " substitutions which may escape proof reading.  In such instances, original meaning may be extrapolated by contextual derivation.    Lucia Gaskins, DO  07/23/2023, 11:00

## 2023-07-31 ENCOUNTER — Encounter (INDEPENDENT_AMBULATORY_CARE_PROVIDER_SITE_OTHER): Payer: Self-pay | Admitting: Internal Medicine

## 2023-07-31 DIAGNOSIS — Z79899 Other long term (current) drug therapy: Secondary | ICD-10-CM

## 2023-08-08 ENCOUNTER — Encounter (INDEPENDENT_AMBULATORY_CARE_PROVIDER_SITE_OTHER): Payer: Self-pay | Admitting: Internal Medicine

## 2023-08-08 MED ORDER — TRAMADOL 50 MG TABLET
1.0000 | ORAL_TABLET | Freq: Four times a day (QID) | ORAL | 0 refills | Status: DC | PRN
Start: 2023-08-08 — End: 2023-08-23

## 2023-08-23 ENCOUNTER — Inpatient Hospital Stay (HOSPITAL_COMMUNITY): Payer: BC Managed Care – PPO

## 2023-08-23 ENCOUNTER — Other Ambulatory Visit: Payer: Self-pay

## 2023-08-23 ENCOUNTER — Inpatient Hospital Stay
Admission: EM | Admit: 2023-08-23 | Discharge: 2023-08-23 | DRG: 377 | Discharge status: Against Medical Advice | Payer: BC Managed Care – PPO | Attending: HOSPITALIST | Admitting: HOSPITALIST

## 2023-08-23 ENCOUNTER — Encounter (HOSPITAL_COMMUNITY): Payer: Self-pay

## 2023-08-23 ENCOUNTER — Encounter (HOSPITAL_COMMUNITY)
Admission: EM | Discharge status: Against Medical Advice | Payer: Self-pay | Source: Home / Self Care | Attending: HOSPITALIST

## 2023-08-23 ENCOUNTER — Encounter (INDEPENDENT_AMBULATORY_CARE_PROVIDER_SITE_OTHER): Payer: Self-pay | Admitting: Internal Medicine

## 2023-08-23 ENCOUNTER — Inpatient Hospital Stay (HOSPITAL_COMMUNITY): Payer: BC Managed Care – PPO | Admitting: Certified Registered"

## 2023-08-23 DIAGNOSIS — T39315A Adverse effect of propionic acid derivatives, initial encounter: Secondary | ICD-10-CM | POA: Diagnosis present

## 2023-08-23 DIAGNOSIS — K2971 Gastritis, unspecified, with bleeding: Secondary | ICD-10-CM | POA: Diagnosis present

## 2023-08-23 DIAGNOSIS — K254 Chronic or unspecified gastric ulcer with hemorrhage: Principal | ICD-10-CM | POA: Diagnosis present

## 2023-08-23 DIAGNOSIS — R000 Tachycardia, unspecified: Secondary | ICD-10-CM

## 2023-08-23 DIAGNOSIS — M5416 Radiculopathy, lumbar region: Secondary | ICD-10-CM

## 2023-08-23 DIAGNOSIS — K2091 Esophagitis, unspecified with bleeding: Secondary | ICD-10-CM | POA: Diagnosis present

## 2023-08-23 DIAGNOSIS — F17210 Nicotine dependence, cigarettes, uncomplicated: Secondary | ICD-10-CM | POA: Diagnosis present

## 2023-08-23 DIAGNOSIS — K920 Hematemesis: Secondary | ICD-10-CM

## 2023-08-23 DIAGNOSIS — K921 Melena: Secondary | ICD-10-CM

## 2023-08-23 DIAGNOSIS — K449 Diaphragmatic hernia without obstruction or gangrene: Secondary | ICD-10-CM | POA: Diagnosis present

## 2023-08-23 DIAGNOSIS — E871 Hypo-osmolality and hyponatremia: Secondary | ICD-10-CM | POA: Diagnosis present

## 2023-08-23 DIAGNOSIS — Z5329 Procedure and treatment not carried out because of patient's decision for other reasons: Secondary | ICD-10-CM | POA: Diagnosis present

## 2023-08-23 DIAGNOSIS — G629 Polyneuropathy, unspecified: Secondary | ICD-10-CM | POA: Diagnosis present

## 2023-08-23 DIAGNOSIS — M549 Dorsalgia, unspecified: Secondary | ICD-10-CM

## 2023-08-23 DIAGNOSIS — Z5986 Financial insecurity: Secondary | ICD-10-CM

## 2023-08-23 DIAGNOSIS — Z79899 Other long term (current) drug therapy: Secondary | ICD-10-CM

## 2023-08-23 DIAGNOSIS — K2960 Other gastritis without bleeding: Secondary | ICD-10-CM

## 2023-08-23 DIAGNOSIS — G8929 Other chronic pain: Secondary | ICD-10-CM | POA: Diagnosis present

## 2023-08-23 DIAGNOSIS — K922 Gastrointestinal hemorrhage, unspecified: Principal | ICD-10-CM | POA: Diagnosis present

## 2023-08-23 HISTORY — DX: Vitamin D deficiency, unspecified: E55.9

## 2023-08-23 HISTORY — DX: Deficiency of other specified B group vitamins: E53.8

## 2023-08-23 LAB — CBC WITH DIFF
BASOPHIL #: 0 10*3/uL (ref 0.00–0.10)
BASOPHIL %: 1 % (ref 0–1)
EOSINOPHIL #: 0 10*3/uL (ref 0.00–0.50)
EOSINOPHIL %: 1 % (ref 1–7)
HCT: 36 % (ref 31.2–41.9)
HGB: 12.5 g/dL (ref 10.9–14.3)
LYMPHOCYTE #: 0.6 10*3/uL — ABNORMAL LOW (ref 1.00–3.00)
LYMPHOCYTE %: 11 % — ABNORMAL LOW (ref 16–44)
MCH: 34 pg — ABNORMAL HIGH (ref 24.7–32.8)
MCHC: 34.7 g/dL (ref 32.3–35.6)
MCV: 98.2 fL — ABNORMAL HIGH (ref 75.5–95.3)
MONOCYTE #: 0.4 10*3/uL (ref 0.30–1.00)
MONOCYTE %: 7 % (ref 5–13)
MPV: 7 fL — ABNORMAL LOW (ref 7.9–10.8)
NEUTROPHIL #: 4.5 10*3/uL (ref 1.85–7.80)
NEUTROPHIL %: 81 % — ABNORMAL HIGH (ref 43–77)
PLATELETS: 232 10*3/uL (ref 140–440)
RBC: 3.67 10*6/uL (ref 3.63–4.92)
RDW: 13.1 % (ref 12.3–17.7)
WBC: 5.6 10*3/uL (ref 3.8–11.8)

## 2023-08-23 LAB — COMPREHENSIVE METABOLIC PANEL, NON-FASTING
ALBUMIN/GLOBULIN RATIO: 1.6 — ABNORMAL HIGH (ref 0.8–1.4)
ALBUMIN: 4.6 g/dL (ref 3.5–5.7)
ALKALINE PHOSPHATASE: 81 U/L (ref 34–104)
ALT (SGPT): 35 U/L (ref 7–52)
ANION GAP: 11 mmol/L (ref 4–13)
AST (SGOT): 36 U/L (ref 13–39)
BILIRUBIN TOTAL: 0.7 mg/dL (ref 0.3–1.0)
BUN/CREA RATIO: 33 — ABNORMAL HIGH (ref 6–22)
BUN: 26 mg/dL — ABNORMAL HIGH (ref 7–25)
CALCIUM, CORRECTED: 9.2 mg/dL (ref 8.9–10.8)
CALCIUM: 9.7 mg/dL (ref 8.6–10.3)
CHLORIDE: 102 mmol/L (ref 98–107)
CO2 TOTAL: 22 mmol/L (ref 21–31)
CREATININE: 0.8 mg/dL (ref 0.60–1.30)
ESTIMATED GFR: 87 mL/min/{1.73_m2} (ref >59)
GLOBULIN: 2.9 (ref 2.9–5.4)
GLUCOSE: 155 mg/dL — ABNORMAL HIGH (ref 74–109)
OSMOLALITY, CALCULATED: 278 mosm/kg (ref 270–290)
POTASSIUM: 3.8 mmol/L (ref 3.5–5.1)
PROTEIN TOTAL: 7.5 g/dL (ref 6.4–8.9)
SODIUM: 135 mmol/L — ABNORMAL LOW (ref 136–145)

## 2023-08-23 LAB — PTT (PARTIAL THROMBOPLASTIN TIME): APTT: 25.6 s (ref 25.0–38.0)

## 2023-08-23 LAB — TYPE AND SCREEN
ABO/RH(D): O POS
ANTIBODY SCREEN: NEGATIVE

## 2023-08-23 LAB — PT/INR
INR: 0.94 (ref 0.84–1.10)
PROTHROMBIN TIME: 11 s (ref 9.8–12.7)

## 2023-08-23 LAB — LACTIC ACID LEVEL W/ REFLEX FOR LEVEL >2.0: LACTIC ACID: 1 mmol/L (ref 0.5–2.2)

## 2023-08-23 SURGERY — GASTROSCOPY WITH BIOPSY
Anesthesia: General | Wound class: Clean Contaminated Wounds-The respiratory, GI, Genital, or urinary

## 2023-08-23 MED ORDER — ONDANSETRON HCL (PF) 4 MG/2 ML INJECTION SOLUTION
INTRAMUSCULAR | Status: AC
Start: 2023-08-23 — End: 2023-08-23
  Filled 2023-08-23: qty 2

## 2023-08-23 MED ORDER — HYDROMORPHONE 2 MG/ML INJECTION WRAPPER
0.5000 mg | INJECTION | Freq: Once | INTRAMUSCULAR | Status: AC
Start: 2023-08-23 — End: 2023-08-23
  Administered 2023-08-23: 0.5 mg via INTRAVENOUS

## 2023-08-23 MED ORDER — MORPHINE 2 MG/ML INJECTION WRAPPER
INJECTION | INTRAMUSCULAR | Status: AC
Start: 2023-08-23 — End: 2023-08-23
  Filled 2023-08-23: qty 1

## 2023-08-23 MED ORDER — PANTOPRAZOLE 40 MG INTRAVENOUS SOLUTION
40.0000 mg | Freq: Two times a day (BID) | INTRAVENOUS | Status: DC
Start: 2023-08-23 — End: 2023-08-23

## 2023-08-23 MED ORDER — PROPOFOL 10 MG/ML IV BOLUS
INJECTION | Freq: Once | INTRAVENOUS | Status: DC | PRN
Start: 2023-08-23 — End: 2023-08-23
  Administered 2023-08-23 (×2): 100 mg via INTRAVENOUS

## 2023-08-23 MED ORDER — PANTOPRAZOLE 40 MG TABLET,DELAYED RELEASE
40.0000 mg | DELAYED_RELEASE_TABLET | Freq: Two times a day (BID) | ORAL | Status: DC
Start: 2023-08-23 — End: 2023-08-23
  Administered 2023-08-23: 40 mg via ORAL
  Filled 2023-08-23: qty 1

## 2023-08-23 MED ORDER — HYDROMORPHONE 2 MG/ML INJECTION WRAPPER
0.5000 mg | INJECTION | INTRAMUSCULAR | Status: AC
Start: 2023-08-23 — End: 2023-08-23
  Administered 2023-08-23: 0.5 mg via INTRAVENOUS

## 2023-08-23 MED ORDER — PANTOPRAZOLE 40 MG TABLET,DELAYED RELEASE
40.0000 mg | DELAYED_RELEASE_TABLET | Freq: Two times a day (BID) | ORAL | 0 refills | Status: DC
Start: 2023-08-24 — End: 2023-09-07

## 2023-08-23 MED ORDER — PANTOPRAZOLE 40 MG INTRAVENOUS SOLUTION
INTRAVENOUS | Status: AC
Start: 2023-08-23 — End: 2023-08-23
  Filled 2023-08-23: qty 10

## 2023-08-23 MED ORDER — GABAPENTIN 100 MG CAPSULE
100.0000 mg | ORAL_CAPSULE | Freq: Three times a day (TID) | ORAL | Status: DC
Start: 2023-08-23 — End: 2023-08-23
  Administered 2023-08-23 (×2): 0 mg via ORAL

## 2023-08-23 MED ORDER — HYDROMORPHONE 2 MG/ML INJECTION WRAPPER
INJECTION | INTRAMUSCULAR | Status: AC
Start: 2023-08-23 — End: 2023-08-23
  Filled 2023-08-23: qty 1

## 2023-08-23 MED ORDER — PANTOPRAZOLE 40 MG INTRAVENOUS SOLUTION
40.0000 mg | INTRAVENOUS | Status: AC
Start: 2023-08-23 — End: 2023-08-23
  Administered 2023-08-23: 40 mg via INTRAVENOUS

## 2023-08-23 MED ORDER — SODIUM CHLORIDE 0.9 % INTRAVENOUS SOLUTION
INTRAVENOUS | Status: DC
Start: 2023-08-23 — End: 2023-08-23
  Administered 2023-08-23: 0 via INTRAVENOUS

## 2023-08-23 MED ORDER — SUCRALFATE 1 GRAM TABLET
ORAL_TABLET | ORAL | Status: AC
Start: 2023-08-23 — End: 2023-08-23
  Filled 2023-08-23: qty 1

## 2023-08-23 MED ORDER — MORPHINE 2 MG/ML INJECTION WRAPPER
1.0000 mg | INJECTION | INTRAMUSCULAR | Status: DC | PRN
Start: 2023-08-23 — End: 2023-08-23
  Administered 2023-08-23 (×2): 1 mg via INTRAVENOUS
  Filled 2023-08-23: qty 1

## 2023-08-23 MED ORDER — TRAMADOL 50 MG TABLET
50.0000 mg | ORAL_TABLET | Freq: Four times a day (QID) | ORAL | Status: DC | PRN
Start: 2023-08-23 — End: 2023-08-23

## 2023-08-23 MED ORDER — ACETAMINOPHEN 300 MG-CODEINE 60 MG TABLET
1.0000 | ORAL_TABLET | ORAL | 1 refills | Status: DC | PRN
Start: 2023-08-23 — End: 2023-11-22

## 2023-08-23 MED ORDER — MAGNESIUM SULFATE 1 GRAM/100 ML IN DEXTROSE 5 % INTRAVENOUS PIGGYBACK
1.0000 g | INJECTION | Freq: Once | INTRAVENOUS | Status: DC
Start: 2023-08-23 — End: 2023-08-23

## 2023-08-23 MED ORDER — LIDOCAINE (PF) 100 MG/5 ML (2 %) INTRAVENOUS SYRINGE
INJECTION | Freq: Once | INTRAVENOUS | Status: DC | PRN
Start: 2023-08-23 — End: 2023-08-23
  Administered 2023-08-23: 100 mg via INTRAVENOUS

## 2023-08-23 MED ORDER — ONDANSETRON HCL (PF) 4 MG/2 ML INJECTION SOLUTION
8.0000 mg | INTRAMUSCULAR | Status: AC
Start: 2023-08-23 — End: 2023-08-23
  Administered 2023-08-23: 8 mg via INTRAVENOUS

## 2023-08-23 MED ORDER — GABAPENTIN 100 MG CAPSULE
ORAL_CAPSULE | ORAL | Status: AC
Start: 2023-08-23 — End: 2023-08-23
  Filled 2023-08-23: qty 1

## 2023-08-23 MED ORDER — SUCRALFATE 1 GRAM TABLET
1.0000 g | ORAL_TABLET | Freq: Four times a day (QID) | ORAL | Status: DC
Start: 2023-08-23 — End: 2023-08-23
  Administered 2023-08-23: 1 g via ORAL
  Administered 2023-08-23: 0 g via ORAL

## 2023-08-23 MED ORDER — ONDANSETRON HCL (PF) 4 MG/2 ML INJECTION SOLUTION
4.0000 mg | Freq: Four times a day (QID) | INTRAMUSCULAR | Status: DC | PRN
Start: 2023-08-23 — End: 2023-08-23

## 2023-08-23 SURGICAL SUPPLY — 3 items
CLIP HMST BRD CATH ROT CONTROL KNOB STRL LF  DISP RSL 360 235CM (ENDOSCOPIC SUPPLIES) ×1 IMPLANT
DETERGENT INSTR 22OZ TRNSPT GEL RINSE FREE NEUT PH PREKLENZ CLR PLSNT LF (MISCELLANEOUS PT CARE ITEMS) ×1 IMPLANT
DUPE USE ITEM 153319 - CLIP LGT RSL 360 ULTRA 235CM BRD ROT CONTROL KNOB 17MM OPN (ENDOSCOPIC SUPPLIES) ×1 IMPLANT

## 2023-08-23 NOTE — ED Triage Notes (Signed)
Vomiting blood with clots since 0130. Had a diarrhea which was "dark black liquid" around 23:30. Takes around 1600 mg of Ibuprofen daily x1 year.

## 2023-08-23 NOTE — Nurses Notes (Signed)
Patient arrives to the floor on a stretcher to room 379-A from T&D. Transfers self from stretcher to bed with standby assist. Alert and oriented. Oriented to unit and room. Given instruction on call light use and call light placed within reach. Family at bedside upon patient arrival to room. Denies pain or nausea. Tolerating sips of water. Respirations regular, even and unlabored. IV to right forearm infusing NS on arrival. Patient assisted with standby assist to bathroom. Able to void. Back to bed to position of comfort per self. No belongings on arrival. Bed check on for safety.

## 2023-08-23 NOTE — OR Surgeon (Signed)
[facility]      EGD NOTE               Patient Name: Stephanie Gallegos  Age:  57 y.o.  Sex:  female  MRN:  [id number]  CSN:  [id number]  Date of Birth: 1966-03-03    Medications Given: Per Nurse Anesthetist    Equipment Used: Olympus [id number]    Date: 08/23/23     Indications:  GI bleeding, hematemesis, Motrin use    The scope was passed into the esophagus without difficulty under direct visualization. There was moderate erythema at the distal esophagus. A 2 cm ulcer was seen at the GE junction with trace old blood. Active bleeding was not seen.  No esophageal varices. A small hiatal hernia was seen. The mucosa in the cardia appeared normal. The diaphragmatic impingement on retroflexion view appeared normal. The Z line appeared slightly irregular.      The mucosa in the fundus was normal. The mucosa in the body and antrum had mild erythema. The mucosa in the duodenal bulb was normal. The mucosa past the duodenal bulb to the 2nd portion of the duodenum appeared normal. The ampulla was not visualized.     Two hemostasis clips were placed over the ulcer at the GE junction (Resolution ultra 360 and Resolution 360). There was good hemostasis.   Biopsies were not taken. Photograph(s) Taken: Yes; Blood loss: none     Impression:  Moderate distal esophagitis, grade 2  2 cm ulcer at the GE junction, 2 hemostasis clips placed  Small hiatal hernia  Mild gastritis   No active bleeding     Recommendations:  Begin Pantoprazole 40 mg po bid, before breakfast and before dinner  Maintain antireflux precautions  Discontinue Motrin use  Follow up in office in 2 weeks.      Rogers Seeds, MD

## 2023-08-23 NOTE — H&P (Signed)
Stephanie Gallegos MEDICINE [facility]    HOSPITALIST H&P    Stephanie Gallegos 57 y.o. female ED01/ED01   Date of Service: 08/23/2023    Date of Admission:  08/23/2023   PCP: Lucia Gaskins, DO Code Status:No Order       Chief Complaint:  "  Vomiting of blood "    HPI:   Patient comes to ED complaining of multiple episodes of vomiting bright red blood and multiple bowel movements of dark tarry stools started last night right before and after midnight patient has been on ibuprofen daily for almost a year for back pain not on any PPI or H2 blockers at home  Never had any similar episodes in the past, occasional heartburn but nothing significant  Patient is pending approval for MRI of the back and possible surgery  Follows with Dr. Allena Katz in [place] for her back  Has not had anymore episodes in the ED  GI contacted by ED and recommended EGD and admission  No other complaints currently                        ED medications:   Medications Administered in the ED   ondansetron (ZOFRAN) 2 mg/mL injection (has no administration in time range)   pantoprazole (PROTONIX) 4 mg/mL injection (40 mg Intravenous Given 08/23/23 0500)   ondansetron (ZOFRAN) 2 mg/mL injection (8 mg Intravenous Given 08/23/23 0500)   HYDROmorphone (DILAUDID) 2 mg/mL injection (0.5 mg Intravenous Given 08/23/23 0600)         PMHx:    Back pain, neuropathy     PSHx:     Open reduction internal fixation     Allergies:    No Known Allergies Social History  Social History     Tobacco Use    Smoking status: Every Day     Current packs/day: 1.00     Types: Cigarettes     Passive exposure: Never    Smokeless tobacco: Never   Substance Use Topics    Alcohol use: Yes    Drug use: Never       Family History  Family Medical History:    None            Home Meds:      Prior to Admission medications    Medication Sig Start Date End Date Taking? Authorizing Provider   buPROPion [facility] SR) 100 mg Oral tablet sustained-release 12 hr Take 1 Tablet (100 mg total) by  mouth Twice daily 07/10/23   Lacey Jensen A, DO   cyanocobalamin (VITAMIN B12) 1,000 mcg/mL Injection Solution Inject 1 mL (1,000 mcg total) into the muscle Every 30 days 10/05/22   Lucia Gaskins, DO   cyclobenzaprine (FLEXERIL) 10 mg Oral Tablet Take 1 Tablet (10 mg total) by mouth Every 8 hours as needed for Muscle spasms 10/05/22   Lacey Jensen A, DO   gabapentin (NEURONTIN) 100 mg Oral Capsule Take 1 Capsule (100 mg total) by mouth Three times a day  Patient not taking: Reported on 07/23/2023 11/16/22   Lucia Gaskins, DO   Halobetasol Propionate (ULTRAVATE) 0.05 % Ointment Apply topically Twice daily 11/16/22   Lucia Gaskins, DO   Ibuprofen (MOTRIN) 800 mg Oral Tablet Take 1 Tablet (800 mg total) by mouth Three times a day as needed for Pain 07/10/23   Lacey Jensen A, DO   pregabalin (LYRICA) 25 mg Oral Capsule Take 1 Capsule (25 mg total) by mouth  Twice daily 07/23/23   Lacey Jensen A, DO   sertraline (ZOLOFT) 100 mg Oral Tablet TAKE 1 & 1/2 (ONE & ONE-HALF) TABLETS BY MOUTH ONCE DAILY 10/05/22   Lucia Gaskins, DO   traMADoL [name]) 50 mg Oral Tablet Take 1-2 Tablets (50-100 mg total) by mouth Every 6 hours as needed for Pain 08/08/23   Lucia Gaskins, DO   traMADoL [name]) 50 mg Oral Tablet Take 1-2 Tablets (50-100 mg total) by mouth Every 6 hours as needed for Pain 06/29/23 08/08/23  Lacey Jensen A, DO          ROS:  All systems reviewed and nothing to note except as mentioned HPI      Results for orders placed or performed during the hospital encounter of 08/23/23 (from the past 24 hour(s))   COMPREHENSIVE METABOLIC PANEL, NON-FASTING   Result Value Ref Range    SODIUM 135 (L) 136 - 145 mmol/L    POTASSIUM 3.8 3.5 - 5.1 mmol/L    CHLORIDE 102 98 - 107 mmol/L    CO2 TOTAL 22 21 - 31 mmol/L    ANION GAP 11 4 - 13 mmol/L    BUN 26 (H) 7 - 25 mg/dL    CREATININE 4.78 2.95 - 1.30 mg/dL    BUN/CREA RATIO 33 (H) 6 - 22    ESTIMATED GFR 87 >59 mL/min/1.78m^2    ALBUMIN 4.6 3.5 - 5.7 g/dL    CALCIUM 9.7 8.6 - 62.1 mg/dL    GLUCOSE  308 (H) 74 - 109 mg/dL    ALKALINE PHOSPHATASE 81 34 - 104 U/L    ALT (SGPT) 35 7 - 52 U/L    AST (SGOT) 36 13 - 39 U/L    BILIRUBIN TOTAL 0.7 0.3 - 1.0 mg/dL    PROTEIN TOTAL 7.5 6.4 - 8.9 g/dL    ALBUMIN/GLOBULIN RATIO 1.6 (H) 0.8 - 1.4    OSMOLALITY, CALCULATED 278 270 - 290 mOsm/kg    CALCIUM, CORRECTED 9.2 8.9 - 10.8 mg/dL    GLOBULIN 2.9 2.9 - 5.4   LACTIC ACID LEVEL W/ REFLEX FOR LEVEL >2.0   Result Value Ref Range    LACTIC ACID 1.0 0.5 - 2.2 mmol/L   PT/INR   Result Value Ref Range    PROTHROMBIN TIME 11.0 9.8 - 12.7 seconds    INR 0.94 0.84 - 1.10   PTT (PARTIAL THROMBOPLASTIN TIME)   Result Value Ref Range    APTT 25.6 25.0 - 38.0 seconds   CBC WITH DIFF   Result Value Ref Range    WBC 5.6 3.8 - 11.8 x10^3/uL    RBC 3.67 3.63 - 4.92 x10^6/uL    HGB 12.5 10.9 - 14.3 g/dL    HCT 65.7 84.6 - 96.2 %    MCV 98.2 (H) 75.5 - 95.3 fL    MCH 34.0 (H) 24.7 - 32.8 pg    MCHC 34.7 32.3 - 35.6 g/dL    RDW 95.2 84.1 - 32.4 %    PLATELETS 232 140 - 440 x10^3/uL    MPV 7.0 (L) 7.9 - 10.8 fL    NEUTROPHIL % 81 (H) 43 - 77 %    LYMPHOCYTE % 11 (L) 16 - 44 %    MONOCYTE % 7 5 - 13 %    EOSINOPHIL % 1 1 - 7 %    BASOPHIL % 1 0 - 1 %    NEUTROPHIL # 4.50 1.85 - 7.80 x10^3/uL    LYMPHOCYTE # 0.60 (  L) 1.00 - 3.00 x10^3/uL    MONOCYTE # 0.40 0.30 - 1.00 x10^3/uL    EOSINOPHIL # 0.00 0.00 - 0.50 x10^3/uL    BASOPHIL # 0.00 0.00 - 0.10 x10^3/uL   TYPE AND SCREEN   Result Value Ref Range    UNITS ORDERED NOT STATED     ABO/RH(D) O POSITIVE     ANTIBODY SCREEN NEGATIVE     SPECIMEN EXPIRATION DATE 08/26/2023,2359           Physical:  Filed Vitals:    08/23/23 0438 08/23/23 0500 08/23/23 0515 08/23/23 0545   BP:  120/88 (!) 134/96 139/88   Pulse: (!) 111 (!) 114 (!) 110 (!) 117   Resp: 20 20 18  (!) 23   Temp:       SpO2: 98% 100% 96% 100%      General: Patient is alert and oriented to person, place, and time. No acute distress. Communicates appropriately.   Head: Normocephalic and atraumatic.    Eyes: Pupils equally round and react  to light and accommodate. Extraocular movements intact.  Conjunctiva normal. Sclerae are normal.    Nose: Nasal passages clear. Mucosa moist.    Throat: Moist oral mucosa. No erythema or exudate of the pharynx. Clear oropharynx.    Neck: Supple. No cervical lymphadenopathy or supraclavicular nodes detected. Trachea midline   Heart:  Tachycardia   Lungs: Clear to auscultation bilaterally with no wheezes or rales. Equal chest excursion.  No conversational dyspnea. No respiratory distress noted.   Abdomen: Soft,  no epigastric tenderness, nondistended belly. Bowel sounds are present in all four quadrants. No rigidity.  No guarding.  No ascites.   Extremities: No edema, cyanosis, or clubbing. Grossly moves all extremities.    Skin: Warm and dry without lesions. No ecchymosis noted.    Neurologic: Cranial nerves II through XII are grossly intact. Sensation to light touch is intact. Strength 5/5 in upper extremities and lower extremities bilaterally.     Psychiatric: Judgment and insight are intact. Mood and affect are appropriate for the situation.       Diagnostic studies:reviewed  No results found.            @PEVF @    Assessments:  There are no active hospital problems to display for this patient.    Upper GI bleed secondary to gastric ulcer gastritis caused by NSAIDs start Protonix 40 mg IV b.i.d. ,Carafate, patient tachycardic start IV fluids         Consult GI for EGD, follow H&H    2. Chronic back pain, neuropathy continue home dose of tramadol add morphine p.r.n. continue gabapentin  may need a prescription for alternative analgesics to ibuprofen on discharge    3. Mild hyponatremia to receive 1 L of normal saline    Plan:  Patient is a GI bleed who will be admitted for the above problems.       Code status: No Order  DVT prophylaxis:  none    Diet: DIET NPO - NOW    Disposition:  The patient is currently acutely ill requiring treatment on the medical floor for  GI bleed. Patient will be closely evaluated monitor  and remove be adjusted accordingly.  Estimated length of stay  less than 48 hr to obtain full medical treatment.      Azalia Bilis, MD    [facility] MEDICINE HOSPITALIST

## 2023-08-23 NOTE — Nurses Notes (Signed)
Patient spoke to Dr. Edmon Crape over phone. Aware of need to stay overnight to be monitored. Patient states they will "think about it and let me know" if she wants to leave AMA. Denies any other needs at this time. Bed check on for safety, call light within reach.

## 2023-08-23 NOTE — Telephone Encounter (Signed)
This current medication has been ERX to their pharmacy.   Lucia Gaskins, DO  08/23/2023 15:49

## 2023-08-23 NOTE — ED Nurses Note (Signed)
Oriented times four. Respirations even and nonlabored on room air. T&D staff here to take patient for EGD. Patient agreeable. PIV site WNL. Dressing CDI. Continues to rate pain 5/10. Patient left this unit via stretcher accompanied by husband and T&D staff. Care relinquished at this time.

## 2023-08-23 NOTE — Anesthesia Postprocedure Evaluation (Signed)
Anesthesia Post Op Evaluation    Patient: Stephanie Gallegos  Procedure(s):  EGD WITH placement of resolution 360 ultra clip and resolution 360 clip    Last Vitals:Temperature: 36.2 C (97.2 F) (08/23/23 1107)  Heart Rate: 98 (08/23/23 1107)  BP (Non-Invasive): (!) 130/90 (08/23/23 1107)  Respiratory Rate: 16 (08/23/23 1107)  SpO2: 100 % (08/23/23 1107)    No notable events documented.    Patient is sufficiently recovered from the effects of anesthesia to participate in the evaluation and has returned to their pre-procedure level.  Patient location during evaluation: PACU       Patient participation: complete - patient participated  Level of consciousness: awake and alert and responsive to verbal stimuli    Pain management: adequate  Airway patency: patent    Anesthetic complications: no  Cardiovascular status: acceptable  Respiratory status: acceptable  Hydration status: acceptable  Patient post-procedure temperature: Pt Normothermic   PONV Status: Absent

## 2023-08-23 NOTE — ED Provider Notes (Signed)
[facility]  Emergency Department  Attending Provider Note      CHIEF COMPLAINT  Chief Complaint   Patient presents with    Vomiting Blood     HISTORY OF PRESENT ILLNESS  Stephanie Gallegos, date of birth 07-Jan-1966, is a 57 y.o. female who presented to the Emergency Department via EMS after multiple episodes of vomiting blood.  Onset around 1:30 a.m.Marland Kitchen  She states she vomited multiple blood clots as well.  The patient states she had a black liquid stool around 10:30 p.m. last night.  States she has a herniated disc in has taken 1600 mg ibuprofen daily for approximately 1 year.    PAST MEDICAL/SURGICAL/FAMILY/SOCIAL HISTORY  History reviewed. No pertinent past medical history.    Family Medical History:    None       Social History     Socioeconomic History    Marital status: Married   Tobacco Use    Smoking status: Every Day     Current packs/day: 1.00     Types: Cigarettes     Passive exposure: Never    Smokeless tobacco: Never   Substance and Sexual Activity    Alcohol use: Yes    Drug use: Never     Social Determinants of Health     Financial Resource Strain: Low Risk  (07/23/2023)    Financial Resource Strain     SDOH Financial: No   Transportation Needs: Low Risk  (07/23/2023)    Transportation Needs     SDOH Transportation: No   Social Connections: Low Risk  (07/23/2023)    Social Connections     SDOH Social Isolation: 5 or more times a week   Intimate Partner Violence: Low Risk  (07/23/2023)    Intimate Partner Violence     SDOH Domestic Violence: No   Housing Stability: Low Risk  (07/23/2023)    Housing Stability     SDOH Housing Situation: I have housing.     SDOH Housing Worry: No      ALLERGIES  No Known Allergies    PHYSICAL EXAM  VITAL SIGNS:  Filed Vitals:    08/23/23 0438 08/23/23 0500 08/23/23 0515 08/23/23 0545   BP:  120/88 (!) 134/96 139/88   Pulse: (!) 111 (!) 114 (!) 110 (!) 117   Resp: 20 20 18  (!) 23   Temp:       SpO2: 98% 100% 96% 100%     GENERAL: PATIENT IS ALERT AND ORIENTED TO  PERSON, PLACE, AND TIME.  HEAD: NORMOCEPHALIC AND ATRAUMATIC.  EYES: PUPILS EQUALLY ROUND AND REACT TO LIGHT. EXTRAOCULAR MOVEMENTS INTACT.  EARS: GROSS HEARING INTACT. EXTERNAL EARS WITHIN NORMAL LIMITS.  NOSE: NO SEPTAL DEVIATION. NASAL PASSAGES CLEAR.  MOUTH:  NORMAL DENTITION. MOIST ORAL MUCOSA.  THROAT:  NO ERYTHEMA OR EXUDATE OF THE PHARYNX.  NECK: SUPPLE. TRACHEA MIDLINE.  CARDIOVASCULAR: REGULAR RATE, AND RHYTHM. NO MURMUR.  LUNGS: CLEAR TO AUSCULTATION BILATERAL.  ABDOMEN: SOFT, NON-TENDER, NON-DISTENDED, AND BOWEL SOUNDS ARE PRESENT.  GENITOURINARY: DEFERRED.  RECTAL: DEFERRED.  EXTREMITIES: NO CYANOSIS, CLUBBING, OR EDEMA.  SKIN: WARM AND DRY.  NEUROLOGIC: CRANIAL NERVES II THROUGH XII ARE GROSSLY INTACT. MOVES ALL 4 EXTREMITIES.  PSYCHIATRIC: JUDGMENT AND INSIGHT ARE SEEMINGLY INTACT. MOOD AND AFFECT ARE APPROPRIATE FOR THE SITUATION.    DIAGNOSTICS  Labs:  Labs listed below were reviewed and interpreted by me.  Results for orders placed or performed during the hospital encounter of 08/23/23   COMPREHENSIVE METABOLIC PANEL, NON-FASTING  Result Value Ref Range    SODIUM 135 (L) 136 - 145 mmol/L    POTASSIUM 3.8 3.5 - 5.1 mmol/L    CHLORIDE 102 98 - 107 mmol/L    CO2 TOTAL 22 21 - 31 mmol/L    ANION GAP 11 4 - 13 mmol/L    BUN 26 (H) 7 - 25 mg/dL    CREATININE 6.57 8.46 - 1.30 mg/dL    BUN/CREA RATIO 33 (H) 6 - 22    ESTIMATED GFR 87 >59 mL/min/1.62m^2    ALBUMIN 4.6 3.5 - 5.7 g/dL    CALCIUM 9.7 8.6 - 96.2 mg/dL    GLUCOSE 952 (H) 74 - 109 mg/dL    ALKALINE PHOSPHATASE 81 34 - 104 U/L    ALT (SGPT) 35 7 - 52 U/L    AST (SGOT) 36 13 - 39 U/L    BILIRUBIN TOTAL 0.7 0.3 - 1.0 mg/dL    PROTEIN TOTAL 7.5 6.4 - 8.9 g/dL    ALBUMIN/GLOBULIN RATIO 1.6 (H) 0.8 - 1.4    OSMOLALITY, CALCULATED 278 270 - 290 mOsm/kg    CALCIUM, CORRECTED 9.2 8.9 - 10.8 mg/dL    GLOBULIN 2.9 2.9 - 5.4   LACTIC ACID LEVEL W/ REFLEX FOR LEVEL >2.0   Result Value Ref Range    LACTIC ACID 1.0 0.5 - 2.2 mmol/L   PT/INR   Result Value Ref  Range    PROTHROMBIN TIME 11.0 9.8 - 12.7 seconds    INR 0.94 0.84 - 1.10   PTT (PARTIAL THROMBOPLASTIN TIME)   Result Value Ref Range    APTT 25.6 25.0 - 38.0 seconds   CBC WITH DIFF   Result Value Ref Range    WBC 5.6 3.8 - 11.8 x10^3/uL    RBC 3.67 3.63 - 4.92 x10^6/uL    HGB 12.5 10.9 - 14.3 g/dL    HCT 84.1 32.4 - 40.1 %    MCV 98.2 (H) 75.5 - 95.3 fL    MCH 34.0 (H) 24.7 - 32.8 pg    MCHC 34.7 32.3 - 35.6 g/dL    RDW 02.7 25.3 - 66.4 %    PLATELETS 232 140 - 440 x10^3/uL    MPV 7.0 (L) 7.9 - 10.8 fL    NEUTROPHIL % 81 (H) 43 - 77 %    LYMPHOCYTE % 11 (L) 16 - 44 %    MONOCYTE % 7 5 - 13 %    EOSINOPHIL % 1 1 - 7 %    BASOPHIL % 1 0 - 1 %    NEUTROPHIL # 4.50 1.85 - 7.80 x10^3/uL    LYMPHOCYTE # 0.60 (L) 1.00 - 3.00 x10^3/uL    MONOCYTE # 0.40 0.30 - 1.00 x10^3/uL    EOSINOPHIL # 0.00 0.00 - 0.50 x10^3/uL    BASOPHIL # 0.00 0.00 - 0.10 x10^3/uL   TYPE AND SCREEN   Result Value Ref Range    UNITS ORDERED NOT STATED     ABO/RH(D) O POSITIVE     ANTIBODY SCREEN NEGATIVE     SPECIMEN EXPIRATION DATE 08/26/2023,2359      Radiology:       ED COURSE/MEDICAL DECISION MAKING  Medications Administered in the ED   ondansetron (ZOFRAN) 2 mg/mL injection (has no administration in time range)   pantoprazole (PROTONIX) 4 mg/mL injection (40 mg Intravenous Given 08/23/23 0500)   ondansetron (ZOFRAN) 2 mg/mL injection (8 mg Intravenous Given 08/23/23 0500)   HYDROmorphone (DILAUDID) 2 mg/mL injection (0.5 mg Intravenous Given 08/23/23  0600)          Medical Decision Making  The patient is into the ED stating she was vomiting blood.  The patient has been taking ibuprofen 80 mg b.i.d. for the past year due to sciatic back pain.  The patient also states she passed a very large black stool last night.  Patient was arrives to the ED tachycardic.  First hemoglobin draw was 12.5.  This is expected to drop.  The case was discussed with Dr. Concha Se who requests the patient be admitted.  He will perform an EGD later today.  The case was  then discussed with the nocturnist and the patient will be admitted.          CLINICAL IMPRESSION  Clinical Impression   Acute upper GI bleeding (Primary)     DISPOSITION  Admitted       DISCHARGE MEDICATIONS  Current Discharge Medication List          Madelaine Bhat Hyacinth Meeker D.O.   08/23/2023, 04:45   [facility]  Department of Emergency Medicine  [facility]    Contents of the document, in whole or in part, are completed utilizing M*Modal dictation technology, please forgive any typographical errors that may exist.   -----

## 2023-08-23 NOTE — ED Nurses Note (Signed)
PATIENT STATES THAT NAUSEA HAS IMPROVED.

## 2023-08-23 NOTE — Anesthesia Preprocedure Evaluation (Signed)
ANESTHESIA PRE-OP EVALUATION  Planned Procedure: EGD WITH BIOPSY  Review of Systems     anesthesia history negative     patient summary reviewed  nursing notes reviewed        Pulmonary   current smoker,   Cardiovascular  negative cardio ROS,   ECG reviewed , Exercise Tolerance: > or = 4 METS        GI/Hepatic/Renal   negative GI/hepatic/renal ROS,         Endo/Other   neg endo/other ROS,       Neuro/Psych/MS   negative neuro/psych ROS,      Cancer    negative hematology/oncology ROS,               Physical Assessment      Airway       Mallampati: II    TM distance: <3 FB    Mouth Opening: good.            Dental       Dentition intact             Pulmonary    Breath sounds clear to auscultation       Cardiovascular    Rhythm: regular  Rate: Normal       Other findings          Plan  ASA 2     Planned anesthesia type: general     total intravenous anesthesia                    Intravenous induction     Anesthesia issues/risks discussed are: Aspiration, PONV, Blood Loss and Sore Throat.  Anesthetic plan and risks discussed with patient  signed consent obtained          Patient's NPO status is appropriate for Anesthesia.

## 2023-08-23 NOTE — Consults (Signed)
[facility]  Gastroenterology/ Hepatology Consult Note    Stephanie Gallegos, Stephanie Gallegos, 57 y.o. female  Encounter Start Date:  08/23/2023  Inpatient Admission Date: 08/23/2023   Date of Birth:  Jan 16, 1966    Date of Service: 08/23/23     Referring physician:  No ref. provider found    Chief Complaint: vomiting blood    Stephanie Gallegos is a 57 y.o., White female who presents with vomiting blood and dark tarry stools that started last night. She has been taking ibuprofen daily for almost a year for back pain. She does not take any PPI's at home.  Hgb remains normal but she is tachycardiac.      REVIEW OF SYSTEMS:    Review of Systems was completed with pertinent ROS as addressed in HPI.            Medications Prior to Admission       Prescriptions    buPROPion (WELLBUTRIN SR) 100 mg Oral tablet sustained-release 12 hr    Take 1 Tablet (100 mg total) by mouth Twice daily    cyanocobalamin (VITAMIN B12) 1,000 mcg/mL Injection Solution    Inject 1 mL (1,000 mcg total) into the muscle Every 30 days    cyclobenzaprine (FLEXERIL) 10 mg Oral Tablet    Take 1 Tablet (10 mg total) by mouth Every 8 hours as needed for Muscle spasms    gabapentin (NEURONTIN) 100 mg Oral Capsule    Take 1 Capsule (100 mg total) by mouth Three times a day    Patient not taking:  Reported on 07/23/2023    Halobetasol Propionate (ULTRAVATE) 0.05 % Ointment    Apply topically Twice daily    Ibuprofen (MOTRIN) 800 mg Oral Tablet    Take 1 Tablet (800 mg total) by mouth Three times a day as needed for Pain    pregabalin (LYRICA) 25 mg Oral Capsule    Take 1 Capsule (25 mg total) by mouth Twice daily    sertraline (ZOLOFT) 100 mg Oral Tablet    TAKE 1 & 1/2 (ONE & ONE-HALF) TABLETS BY MOUTH ONCE DAILY    traMADoL (ULTRAM) 50 mg Oral Tablet    Take 1-2 Tablets (50-100 mg total) by mouth Every 6 hours as needed for Pain             Allergies:    No Known Allergies      ED medications:   Medications Administered  in the ED   ondansetron (ZOFRAN) 2 mg/mL injection (has no administration in time range)   pantoprazole (PROTONIX) 4 mg/mL injection (40 mg Intravenous Given 08/23/23 0500)   ondansetron (ZOFRAN) 2 mg/mL injection (8 mg Intravenous Given 08/23/23 0500)   HYDROmorphone (DILAUDID) 2 mg/mL injection (0.5 mg Intravenous Given 08/23/23 0600)         Home Meds:      Prior to Admission medications    Medication Sig Start Date End Date Taking? Authorizing Provider   buPROPion [facility] SR) 100 mg Oral tablet sustained-release 12 hr Take 1 Tablet (100 mg total) by mouth Twice daily 07/10/23   Lacey Jensen A, DO   cyanocobalamin (VITAMIN B12) 1,000 mcg/mL Injection Solution Inject 1 mL (1,000 mcg total) into the muscle Every 30 days 10/05/22   Lucia Gaskins, DO   cyclobenzaprine (FLEXERIL) 10 mg Oral Tablet Take 1 Tablet (10 mg total) by mouth Every 8 hours as needed for Muscle spasms 10/05/22   Lacey Jensen A, DO   gabapentin (NEURONTIN) 100 mg  Oral Capsule Take 1 Capsule (100 mg total) by mouth Three times a day  Patient not taking: Reported on 07/23/2023 11/16/22   Lucia Gaskins, DO   Halobetasol Propionate (ULTRAVATE) 0.05 % Ointment Apply topically Twice daily 11/16/22   Lucia Gaskins, DO   Ibuprofen (MOTRIN) 800 mg Oral Tablet Take 1 Tablet (800 mg total) by mouth Three times a day as needed for Pain 07/10/23   Lucia Gaskins, DO   pregabalin (LYRICA) 25 mg Oral Capsule Take 1 Capsule (25 mg total) by mouth Twice daily 07/23/23   Lacey Jensen A, DO   sertraline (ZOLOFT) 100 mg Oral Tablet TAKE 1 & 1/2 (ONE & ONE-HALF) TABLETS BY MOUTH ONCE DAILY 10/05/22   Lucia Gaskins, DO   traMADoL [name]) 50 mg Oral Tablet Take 1-2 Tablets (50-100 mg total) by mouth Every 6 hours as needed for Pain 08/08/23   Lucia Gaskins, DO   traMADoL [name]) 50 mg Oral Tablet Take 1-2 Tablets (50-100 mg total) by mouth Every 6 hours as needed for Pain 06/29/23 08/08/23  Lucia Gaskins, DO          Inpatient Meds:      gabapentin (NEURONTIN) capsule, 100 mg,  Oral, 3x/day  HYDROmorphone (DILAUDID) 2 mg/mL injection, 0.5 mg, Intravenous, Once  morphine 2 mg/mL injection, 1 mg, Intravenous, Q4H PRN  NS premix infusion, , Intravenous, Continuous  ondansetron (ZOFRAN) 2 mg/mL injection, 4 mg, Intravenous, Q6H PRN  pantoprazole (PROTONIX) 4 mg/mL injection, 40 mg, Intravenous, Q12H  sucralfate (CARAFATE) tablet, 1 g, Oral, Q6HRS  traMADol (ULTRAM) tablet, 50 mg, Oral, Q6H PRN             PMHx:    History reviewed. No pertinent past medical history.  Back pain, neuropathy   PSHx:    ORIF       Family History  Family Medical History:    None        Social History  Social History     Tobacco Use    Smoking status: Every Day     Current packs/day: 1.00     Types: Cigarettes     Passive exposure: Never    Smokeless tobacco: Never   Substance Use Topics    Alcohol use: Yes    Drug use: Never             Previous GI Procedures:  08/23/2023      Vital Signs:  Temp (24hrs) Max:36.2 C (97.1 F)      Systolic (24hrs), Avg:130 , Min:106 , Max:178     Diastolic (24hrs), Avg:97, Min:85, Max:133    Temp  Avg: 36.2 C (97.1 F)  Min: 36.2 C (97.1 F)  Max: 36.2 C (97.1 F)  MAP (Non-Invasive)  Avg: 109.2 mmHG  Min: 94 mmHG  Max: 149 mmHG  Pulse  Avg: 107.3  Min: 98  Max: 120  Resp  Avg: 18.5  Min: 14  Max: 23  SpO2  Avg: 97.6 %  Min: 94 %  Max: 100 %           PHYSICAL EXAM:   Temperature: 36.2 C (97.1 F)  Heart Rate: (!) 107  BP (Non-Invasive): (!) 133/101  Respiratory Rate: 18  SpO2: 99 %  Vital signs reviewed    General  No acute distress  Skin color normal  HEENT  Normal, no icterus, neck supple  Lungs clear bilaterally  Card RRR,   Abdomen  BS active, no tenderness, no guarding,  no distension,   Ext  no edema       RELEVANT LABS:  Labs:    Results for orders placed or performed during the hospital encounter of 08/23/23 (from the past 24 hour(s))   COMPREHENSIVE METABOLIC PANEL, NON-FASTING   Result Value Ref Range    SODIUM 135 (L) 136 - 145 mmol/L    POTASSIUM 3.8 3.5 - 5.1 mmol/L     CHLORIDE 102 98 - 107 mmol/L    CO2 TOTAL 22 21 - 31 mmol/L    ANION GAP 11 4 - 13 mmol/L    BUN 26 (H) 7 - 25 mg/dL    CREATININE 2.84 1.32 - 1.30 mg/dL    BUN/CREA RATIO 33 (H) 6 - 22    ESTIMATED GFR 87 >59 mL/min/1.4m^2    ALBUMIN 4.6 3.5 - 5.7 g/dL    CALCIUM 9.7 8.6 - 44.0 mg/dL    GLUCOSE 102 (H) 74 - 109 mg/dL    ALKALINE PHOSPHATASE 81 34 - 104 U/L    ALT (SGPT) 35 7 - 52 U/L    AST (SGOT) 36 13 - 39 U/L    BILIRUBIN TOTAL 0.7 0.3 - 1.0 mg/dL    PROTEIN TOTAL 7.5 6.4 - 8.9 g/dL    ALBUMIN/GLOBULIN RATIO 1.6 (H) 0.8 - 1.4    OSMOLALITY, CALCULATED 278 270 - 290 mOsm/kg    CALCIUM, CORRECTED 9.2 8.9 - 10.8 mg/dL    GLOBULIN 2.9 2.9 - 5.4   LACTIC ACID LEVEL W/ REFLEX FOR LEVEL >2.0   Result Value Ref Range    LACTIC ACID 1.0 0.5 - 2.2 mmol/L   PT/INR   Result Value Ref Range    PROTHROMBIN TIME 11.0 9.8 - 12.7 seconds    INR 0.94 0.84 - 1.10   PTT (PARTIAL THROMBOPLASTIN TIME)   Result Value Ref Range    APTT 25.6 25.0 - 38.0 seconds   CBC WITH DIFF   Result Value Ref Range    WBC 5.6 3.8 - 11.8 x10^3/uL    RBC 3.67 3.63 - 4.92 x10^6/uL    HGB 12.5 10.9 - 14.3 g/dL    HCT 72.5 36.6 - 44.0 %    MCV 98.2 (H) 75.5 - 95.3 fL    MCH 34.0 (H) 24.7 - 32.8 pg    MCHC 34.7 32.3 - 35.6 g/dL    RDW 34.7 42.5 - 95.6 %    PLATELETS 232 140 - 440 x10^3/uL    MPV 7.0 (L) 7.9 - 10.8 fL    NEUTROPHIL % 81 (H) 43 - 77 %    LYMPHOCYTE % 11 (L) 16 - 44 %    MONOCYTE % 7 5 - 13 %    EOSINOPHIL % 1 1 - 7 %    BASOPHIL % 1 0 - 1 %    NEUTROPHIL # 4.50 1.85 - 7.80 x10^3/uL    LYMPHOCYTE # 0.60 (L) 1.00 - 3.00 x10^3/uL    MONOCYTE # 0.40 0.30 - 1.00 x10^3/uL    EOSINOPHIL # 0.00 0.00 - 0.50 x10^3/uL    BASOPHIL # 0.00 0.00 - 0.10 x10^3/uL   TYPE AND SCREEN   Result Value Ref Range    UNITS ORDERED NOT STATED     ABO/RH(D) O POSITIVE     ANTIBODY SCREEN NEGATIVE     SPECIMEN EXPIRATION DATE 08/26/2023,2359           No results found for: "CARCIEMBAG", "MQAD24", "CAAGGICA199", "CA199"   CBC  Diff   Lab Results  Component Value  Date/Time    WBC 5.6 08/23/2023 04:53 AM    HGB 12.5 08/23/2023 04:53 AM    HCT 36.0 08/23/2023 04:53 AM    PLTCNT 232 08/23/2023 04:53 AM    RBC 3.67 08/23/2023 04:53 AM    MCV 98.2 (H) 08/23/2023 04:53 AM    MCHC 34.7 08/23/2023 04:53 AM    MCH 34.0 (H) 08/23/2023 04:53 AM    RDW 13.1 08/23/2023 04:53 AM    MPV 7.0 (L) 08/23/2023 04:53 AM    Lab Results   Component Value Date/Time    PMNS 81 (H) 08/23/2023 04:53 AM    LYMPHOCYTES 11 (L) 08/23/2023 04:53 AM    EOSINOPHIL 1 08/23/2023 04:53 AM    MONOCYTES 7 08/23/2023 04:53 AM    BASOPHILS 1 08/23/2023 04:53 AM    BASOPHILS 0.00 08/23/2023 04:53 AM    PMNABS 4.50 08/23/2023 04:53 AM    LYMPHSABS 0.60 (L) 08/23/2023 04:53 AM    EOSABS 0.00 08/23/2023 04:53 AM    MONOSABS 0.40 08/23/2023 04:53 AM           No results found for: "CARCIEMBAG" @LASTIFOB @    Imaging Studies:  No results found for this or any previous visit (from the past [id number] hour(s)).  No results found for this or any previous visit (from the past 147829562 hour(s)).  No results found for this or any previous visit (from the past 130865784 hour(s)).  No results found for this or any previous visit (from the past 696295284 hour(s)).  No results found for this or any previous visit (from the past 132440102 hour(s)).  No results found for this or any previous visit (from the past [id number] hour(s)).    Assessment/Plan and Recommendations:    Hematemesis    EGD today. Risks of bleeding,infection, perforation, slowing of respirations and allergic reaction discussed. Further recommendations to follow after procedure. Discussed with Dr.K.Patel and treatment by him.       Ulyess Blossom, NP-C

## 2023-08-23 NOTE — Nurses Notes (Signed)
Patient requesting AMA paper. States she is ready to go home. Dr. Edmon Crape notified by Charge nurse. Patient dressed self. IV and telemetry monitor removed. Signed AMA form. Ambulatory off the floor with family escort.

## 2023-08-23 NOTE — Telephone Encounter (Signed)
As far as pain medication is concerned the only 2 medicines that I am able to right long-term is Ultram or Tylenol with codeine.  The Lyrica can be increased to 50 mg twice a day and can even go higher if needed.  As far as any really good medicine for pain that would be the hydrocodone or oxycodone and I am limited to only a 7 day prescription of those.

## 2023-08-23 NOTE — Discharge Summary (Signed)
[place] MEDICINE [facility]     DISCHARGE SUMMARY      PATIENT NAME:  Stephanie Gallegos   MRN:  [id number]  DOB:  1966/09/04    INPATIENT ADMISSION DATE: 08/23/2023   DATE OF DISCHARGE:  08/23/23     ATTENDING PHYSICIAN:  Tarry Kos, MD    HOSPITAL PRESENTATION:  Please see full admission H&P for details.    As per HPI:  57 year old female presented to the ED with multiple episodes of vomiting bright red blood and dark tarry stools.  Has been using ibuprofen almost daily for the past 1 year.  And has not been on any PPI.  Patient was admitted for further evaluation by GI.    FURTHER HOSPITAL COURSE:    She underwent endoscopy earlier this morning which showed moderate distal esophagitis grade 2, 2 cm ulcer at the GE junction status post 2 hemostasis clips, small hiatal hernia and mild gastritis.  She was recommended 40 mg pantoprazole twice daily and to discontinue ibuprofen.  Given the ulcer and placing the clips it was advised that she stay overnight.    Stephanie Gallegos has elected to leave against medical advice. In my opinion, the patient has capacity to leave AMA. The patient is clinically sober, free from distracting injury, appears to have intact insight and judgment and reason, and in my opinion has capacity to make decisions. I explained to the patient that his symptoms and the patient verbalized understanding of my concerns.  I had a discussion with the patient and her husband about their workup and results, and that they may still have bleeding despite hemostasis clips. I informed the patient that the next step in diagnosis and treatment would be to observe overnight for anymore episodes of bloody vomiting and monitor hemoglobin, and they verbalized understanding of this as well. I explained the risks of leaving without further workup or treatment, which included reasonably foreseeable complications such as death, serious injury, permanent disability.   The patient is refusing any further  care, and is leaving against medical advice. I am unable to convince the patient to stay. I have also explained that they were welcome to return to the ER for further evaluation whenever they choose. I have asked the patient to follow up with their primary doctor as soon as possible. I have answered all their questions. Patient signed AMA paperwork.  I sent prescription of pantoprazole to her pharmacy and requested that she at least follow up with GI specialist in 2 weeks.    PROBLEM LIST:  Active Hospital Problems    Diagnosis Date Noted    Principal Problem: UGIB (upper gastrointestinal bleed) [K92.2] 08/23/2023      Resolved Hospital Problems   No resolved problems to display.     Active Non-Hospital Problems    Diagnosis Date Noted    Lumbar radiculopathy, chronic 07/23/2023    High risk medication use 07/23/2023    Acute left lumbar radiculopathy 11/16/2022    Left leg weakness 11/16/2022    Pelvic pain 10/26/2022    Pain of left hip 10/26/2022       Nutrition:   DIET CLEAR LIQUID Do you want to initiate MNT Protocol? Yes             Additional clinical characteristics related to nutrition:    - monitor for weight changes   - monitor intake and output    - monitor bowel functions        Lab Results  Component Value Date    ALBUMIN 4.6 08/23/2023        PHYSICAL EXAM DAY OF DISCHARGE:  BP (!) 141/91   Pulse 94   Temp 36.9 C (98.4 F)   Resp 18   Ht 1.778 m (5\' 10" )   Wt 74.8 kg (165 lb)   LMP  (LMP Unknown)   SpO2 100%   BMI 23.68 kg/m        Physical Exam  Could not be performed as the patient left against medical advice    LABS WITHIN LAST 24 HOURS:   Results for orders placed or performed during the hospital encounter of 08/23/23 (from the past 24 hour(s))   COMPREHENSIVE METABOLIC PANEL, NON-FASTING   Result Value Ref Range    SODIUM 135 (L) 136 - 145 mmol/L    POTASSIUM 3.8 3.5 - 5.1 mmol/L    CHLORIDE 102 98 - 107 mmol/L    CO2 TOTAL 22 21 - 31 mmol/L    ANION GAP 11 4 - 13 mmol/L    BUN 26 (H)  7 - 25 mg/dL    CREATININE 1.61 0.96 - 1.30 mg/dL    BUN/CREA RATIO 33 (H) 6 - 22    ESTIMATED GFR 87 >59 mL/min/1.75m^2    ALBUMIN 4.6 3.5 - 5.7 g/dL    CALCIUM 9.7 8.6 - 04.5 mg/dL    GLUCOSE 409 (H) 74 - 109 mg/dL    ALKALINE PHOSPHATASE 81 34 - 104 U/L    ALT (SGPT) 35 7 - 52 U/L    AST (SGOT) 36 13 - 39 U/L    BILIRUBIN TOTAL 0.7 0.3 - 1.0 mg/dL    PROTEIN TOTAL 7.5 6.4 - 8.9 g/dL    ALBUMIN/GLOBULIN RATIO 1.6 (H) 0.8 - 1.4    OSMOLALITY, CALCULATED 278 270 - 290 mOsm/kg    CALCIUM, CORRECTED 9.2 8.9 - 10.8 mg/dL    GLOBULIN 2.9 2.9 - 5.4   LACTIC ACID LEVEL W/ REFLEX FOR LEVEL >2.0   Result Value Ref Range    LACTIC ACID 1.0 0.5 - 2.2 mmol/L   PT/INR   Result Value Ref Range    PROTHROMBIN TIME 11.0 9.8 - 12.7 seconds    INR 0.94 0.84 - 1.10   PTT (PARTIAL THROMBOPLASTIN TIME)   Result Value Ref Range    APTT 25.6 25.0 - 38.0 seconds   CBC WITH DIFF   Result Value Ref Range    WBC 5.6 3.8 - 11.8 x10^3/uL    RBC 3.67 3.63 - 4.92 x10^6/uL    HGB 12.5 10.9 - 14.3 g/dL    HCT 81.1 91.4 - 78.2 %    MCV 98.2 (H) 75.5 - 95.3 fL    MCH 34.0 (H) 24.7 - 32.8 pg    MCHC 34.7 32.3 - 35.6 g/dL    RDW 95.6 21.3 - 08.6 %    PLATELETS 232 140 - 440 x10^3/uL    MPV 7.0 (L) 7.9 - 10.8 fL    NEUTROPHIL % 81 (H) 43 - 77 %    LYMPHOCYTE % 11 (L) 16 - 44 %    MONOCYTE % 7 5 - 13 %    EOSINOPHIL % 1 1 - 7 %    BASOPHIL % 1 0 - 1 %    NEUTROPHIL # 4.50 1.85 - 7.80 x10^3/uL    LYMPHOCYTE # 0.60 (L) 1.00 - 3.00 x10^3/uL    MONOCYTE # 0.40 0.30 - 1.00 x10^3/uL    EOSINOPHIL # 0.00 0.00 -  0.50 x10^3/uL    BASOPHIL # 0.00 0.00 - 0.10 x10^3/uL   TYPE AND SCREEN   Result Value Ref Range    UNITS ORDERED NOT STATED     ABO/RH(D) O POSITIVE     ANTIBODY SCREEN NEGATIVE     SPECIMEN EXPIRATION DATE 08/26/2023,2359         IMAGING WITHIN LAST 24 HOURS:   No results found.  No orders to display        MICROBIOLOGY WITHIN LAST 24 HOURS:   No results found for any visits on 08/23/23 (from the past 24 hour(s)).     DISCHARGE MEDICATIONS:      Current Discharge Medication List        START taking these medications.        Details   pantoprazole 40 mg Tablet, Delayed Release (E.C.)  Commonly known as: PROTONIX  Start taking on: August 24, 2023   40 mg, Oral, 2 TIMES DAILY BEFORE MEALS  Qty: 28 Tablet  Refills: 0            CONTINUE these medications - NO CHANGES were made during your visit.        Details   buPROPion 100 mg tablet sustained-release 12 hr  Commonly known as: WELLBUTRIN SR   100 mg, Oral, 2 TIMES DAILY  Qty: 180 Tablet  Refills: 1     cyanocobalamin 1,000 mcg/mL Solution  Commonly known as: VITAMIN B12   1,000 mcg, IntraMUSCULAR, EVERY 30 DAYS  Qty: 3 mL  Refills: 3     cyclobenzaprine 10 mg Tablet  Commonly known as: FLEXERIL   10 mg, Oral, EVERY 8 HOURS PRN  Qty: 90 Tablet  Refills: 3     Halobetasol Propionate 0.05 % Ointment  Commonly known as: ULTRAVATE   Topical, 2 TIMES DAILY  Qty: 50 g  Refills: 2     pregabalin 25 mg Capsule  Commonly known as: LYRICA   25 mg, Oral, 2 TIMES DAILY  Qty: 60 Capsule  Refills: 2            STOP taking these medications.      Ibuprofen 800 mg Tablet  Commonly known as: MOTRIN     traMADoL 50 mg Tablet  Commonly known as: ULTRAM             DISCHARGE DISPOSITION:  Leaving against medical advice    DISCHARGE INSTRUCTIONS:  No discharge procedures on file.         Copies sent to Care Team         Relationship Specialty Notifications Start End    Lucia Gaskins, DO PCP - General INTERNAL MEDICINE Admissions 05/31/22     Phone: [phone] Fax: [phone]         [address] [place] [address]            The hospitalist examined patient, reviewed material, and agreed with discharge at this time.   >30 minutes total were spent coordinating discharge day today    Linsy Ehresman Jan Fireman, MD  [facility]   08/23/2023      This note was partially created using voice recognition software and is inherently subject to errors including those of syntax and "sound alike " substitutions which may escape proof  reading. In such instances, original meaning may be extrapolated by contextual derivation.

## 2023-08-27 ENCOUNTER — Encounter (INDEPENDENT_AMBULATORY_CARE_PROVIDER_SITE_OTHER): Payer: Self-pay | Admitting: Internal Medicine

## 2023-08-27 ENCOUNTER — Other Ambulatory Visit (INDEPENDENT_AMBULATORY_CARE_PROVIDER_SITE_OTHER): Payer: Self-pay | Admitting: Internal Medicine

## 2023-08-27 MED ORDER — PANTOPRAZOLE 40 MG TABLET,DELAYED RELEASE
40.0000 mg | DELAYED_RELEASE_TABLET | Freq: Two times a day (BID) | ORAL | 0 refills | Status: DC
Start: 2023-08-27 — End: 2023-10-26

## 2023-08-27 NOTE — Telephone Encounter (Signed)
Ok to send

## 2023-08-30 ENCOUNTER — Other Ambulatory Visit (INDEPENDENT_AMBULATORY_CARE_PROVIDER_SITE_OTHER): Payer: Self-pay | Admitting: Internal Medicine

## 2023-08-30 DIAGNOSIS — M5416 Radiculopathy, lumbar region: Secondary | ICD-10-CM

## 2023-08-30 MED ORDER — TRAMADOL 50 MG TABLET
1.0000 | ORAL_TABLET | Freq: Four times a day (QID) | ORAL | 0 refills | Status: DC | PRN
Start: 2023-08-30 — End: 2023-09-25

## 2023-08-30 MED ORDER — PREGABALIN 50 MG CAPSULE
50.0000 mg | ORAL_CAPSULE | Freq: Two times a day (BID) | ORAL | 1 refills | Status: DC
Start: 2023-08-30 — End: 2023-09-21

## 2023-09-21 ENCOUNTER — Encounter (INDEPENDENT_AMBULATORY_CARE_PROVIDER_SITE_OTHER): Payer: Self-pay | Admitting: Internal Medicine

## 2023-09-21 DIAGNOSIS — M5416 Radiculopathy, lumbar region: Secondary | ICD-10-CM

## 2023-09-21 MED ORDER — PREGABALIN 100 MG CAPSULE
100.0000 mg | ORAL_CAPSULE | Freq: Two times a day (BID) | ORAL | 2 refills | Status: DC
Start: 2023-09-21 — End: 2023-10-16

## 2023-09-21 NOTE — Telephone Encounter (Signed)
This current medication has been ERX to their pharmacy.   Lucia Gaskins, DO  09/21/2023 12:54

## 2023-09-25 ENCOUNTER — Other Ambulatory Visit (INDEPENDENT_AMBULATORY_CARE_PROVIDER_SITE_OTHER): Payer: Self-pay | Admitting: Internal Medicine

## 2023-09-25 MED ORDER — TRAMADOL 50 MG TABLET
1.0000 | ORAL_TABLET | Freq: Four times a day (QID) | ORAL | 0 refills | Status: DC | PRN
Start: 2023-09-25 — End: 2023-10-16

## 2023-09-25 NOTE — Telephone Encounter (Signed)
Needs approval

## 2023-09-30 ENCOUNTER — Other Ambulatory Visit (INDEPENDENT_AMBULATORY_CARE_PROVIDER_SITE_OTHER): Payer: Self-pay | Admitting: Internal Medicine

## 2023-10-16 ENCOUNTER — Encounter (INDEPENDENT_AMBULATORY_CARE_PROVIDER_SITE_OTHER): Payer: Self-pay | Admitting: Internal Medicine

## 2023-10-16 ENCOUNTER — Other Ambulatory Visit (INDEPENDENT_AMBULATORY_CARE_PROVIDER_SITE_OTHER): Payer: Self-pay | Admitting: Internal Medicine

## 2023-10-16 DIAGNOSIS — M5416 Radiculopathy, lumbar region: Secondary | ICD-10-CM

## 2023-10-16 MED ORDER — PREGABALIN 100 MG CAPSULE
200.0000 mg | ORAL_CAPSULE | Freq: Two times a day (BID) | ORAL | 2 refills | Status: DC
Start: 2023-10-16 — End: 2024-01-17

## 2023-10-16 MED ORDER — TRAMADOL 50 MG TABLET
1.0000 | ORAL_TABLET | Freq: Four times a day (QID) | ORAL | 0 refills | Status: DC | PRN
Start: 2023-10-16 — End: 2023-11-14

## 2023-10-16 NOTE — Telephone Encounter (Signed)
This current medication has been ERX to their pharmacy.   Lucia Gaskins, DO  10/16/2023 12:46

## 2023-10-19 ENCOUNTER — Other Ambulatory Visit: Payer: Self-pay

## 2023-10-26 ENCOUNTER — Other Ambulatory Visit (INDEPENDENT_AMBULATORY_CARE_PROVIDER_SITE_OTHER): Payer: Self-pay | Admitting: Internal Medicine

## 2023-10-26 MED ORDER — PANTOPRAZOLE 40 MG TABLET,DELAYED RELEASE
40.0000 mg | DELAYED_RELEASE_TABLET | Freq: Two times a day (BID) | ORAL | 0 refills | Status: DC
Start: 2023-10-26 — End: 2024-02-22

## 2023-11-14 ENCOUNTER — Other Ambulatory Visit (INDEPENDENT_AMBULATORY_CARE_PROVIDER_SITE_OTHER): Payer: Self-pay | Admitting: Internal Medicine

## 2023-11-14 MED ORDER — TRAMADOL 50 MG TABLET
1.0000 | ORAL_TABLET | Freq: Four times a day (QID) | ORAL | 0 refills | Status: DC | PRN
Start: 2023-11-14 — End: 2023-12-18

## 2023-11-22 ENCOUNTER — Ambulatory Visit (INDEPENDENT_AMBULATORY_CARE_PROVIDER_SITE_OTHER): Payer: BC Managed Care – PPO | Admitting: Internal Medicine

## 2023-11-22 ENCOUNTER — Encounter (INDEPENDENT_AMBULATORY_CARE_PROVIDER_SITE_OTHER): Payer: Self-pay | Admitting: Internal Medicine

## 2023-11-22 ENCOUNTER — Other Ambulatory Visit: Payer: Self-pay

## 2023-11-22 VITALS — BP 137/96 | HR 96 | Ht 70.0 in | Wt 171.8 lb

## 2023-11-22 DIAGNOSIS — R102 Pelvic and perineal pain: Secondary | ICD-10-CM

## 2023-11-22 DIAGNOSIS — R739 Hyperglycemia, unspecified: Secondary | ICD-10-CM

## 2023-11-22 DIAGNOSIS — K922 Gastrointestinal hemorrhage, unspecified: Secondary | ICD-10-CM

## 2023-11-22 DIAGNOSIS — M5416 Radiculopathy, lumbar region: Secondary | ICD-10-CM

## 2023-11-22 DIAGNOSIS — M25552 Pain in left hip: Secondary | ICD-10-CM

## 2023-11-22 MED ORDER — CYCLOBENZAPRINE 10 MG TABLET
10.0000 mg | ORAL_TABLET | Freq: Three times a day (TID) | ORAL | 3 refills | Status: AC | PRN
Start: 2023-11-22 — End: ?

## 2023-11-22 MED ORDER — MOUNJARO 2.5 MG/0.5 ML SUBCUTANEOUS PEN INJECTOR
2.5000 mg | PEN_INJECTOR | SUBCUTANEOUS | 2 refills | Status: DC
Start: 2023-11-22 — End: 2024-05-28

## 2023-11-22 NOTE — Progress Notes (Signed)
INTERNAL MEDICINE, [facility]  [address]  [place] [address]       Name: Stephanie Gallegos MRN:  [id number]   Date: 11/22/2023 Age: 58 y.o.       Chief Complaint:    Chief Complaint   Patient presents with    Follow Up 3 Months     Was in the ER in October for vomiting blood.     Would like to have her back surgury in April, said she will need a MRI by then.  Said the Lyrica is helping with the pain.         HPI:  Stephanie Gallegos is a 58 y.o. female who presents to the office today for follow-up.  Patient is having some minor issues as described below which I have addressed in total with the patient.        Past Medical History:  Past Medical History:   Diagnosis Date    B12 deficiency     Vitamin D deficiency          Past Surgical History:   Procedure Laterality Date    HX OTHER      toe removed      Current Outpatient Medications   Medication Sig    buPROPion (WELLBUTRIN SR) 100 mg Oral tablet sustained-release 12 hr Take 1 Tablet (100 mg total) by mouth Twice daily (Patient taking differently: Take 1 Tablet (100 mg total) by mouth Once a day)    cyanocobalamin (VITAMIN B12) 1,000 mcg/mL Injection Solution INJECT 1 ML(1,000 MCG TOTAL) INTO THE MUSCLE EVERY 30 DAYS    cyclobenzaprine (FLEXERIL) 10 mg Oral Tablet Take 1 Tablet (10 mg total) by mouth Every 8 hours as needed for Muscle spasms    pantoprazole (PROTONIX) 40 mg Oral Tablet, Delayed Release (E.C.) Take 1 Tablet (40 mg total) by mouth Twice a day before meals    pregabalin (LYRICA) 100 mg Oral Capsule Take 2 Capsules (200 mg total) by mouth Twice daily    sertraline (ZOLOFT) 100 mg Oral Tablet Take 1 Tablet (100 mg total) by mouth Once a day    traMADoL (ULTRAM) 50 mg Oral Tablet Take 1-2 Tablets (50-100 mg total) by mouth Every 6 hours as needed for Pain     No Known Allergies    Family History:  Family Medical History:    None           Social History:   Social History     Tobacco Use   Smoking Status Every Day    Current  packs/day: 1.00    Types: Cigarettes    Passive exposure: Never   Smokeless Tobacco Never     Social History     Substance and Sexual Activity   Alcohol Use Yes    Comment: social     Social History     Occupational History    Not on file       Review of Systems:  Review of systems as discussed in HPI    Problem List:  Patient Active Problem List   Diagnosis    Pelvic pain    Pain of left hip    Acute left lumbar radiculopathy    Left leg weakness    Lumbar radiculopathy, chronic    High risk medication use    UGIB (upper gastrointestinal bleed)       Physical Examination:  BP (!) 137/96 (Site: Left Arm, Patient Position: Sitting, Cuff Size: Adult)  Pulse 96   Ht 1.778 m (5\' 10" )   Wt 77.9 kg (171 lb 12.8 oz)   LMP  (LMP Unknown)   SpO2 98%   BMI 24.65 kg/m       Physical Exam  Vitals and nursing note reviewed.   Constitutional:       General: She is not in acute distress.     Appearance: Normal appearance.   HENT:      Head: Normocephalic and atraumatic.      Right Ear: Tympanic membrane normal.      Left Ear: Tympanic membrane normal.      Nose: Nose normal.      Mouth/Throat:      Mouth: Mucous membranes are moist.      Pharynx: Oropharynx is clear.   Eyes:      General:         Right eye: No discharge.         Left eye: No discharge.      Conjunctiva/sclera: Conjunctivae normal.      Pupils: Pupils are equal, round, and reactive to light.   Cardiovascular:      Rate and Rhythm: Normal rate and regular rhythm.      Pulses:           Carotid pulses are 0 on the right side and 0 on the left side.       Popliteal pulses are 2+ on the right side and 2+ on the left side.        Dorsalis pedis pulses are 2+ on the right side and 2+ on the left side.      Heart sounds: Normal heart sounds.   Pulmonary:      Effort: Pulmonary effort is normal.      Breath sounds: Normal breath sounds. No wheezing, rhonchi or rales.   Abdominal:      General: Bowel sounds are normal. There is no distension.      Palpations:  Abdomen is soft.      Tenderness: There is abdominal tenderness in the left lower quadrant. There is guarding. There is no rebound. Positive signs include obturator sign.   Musculoskeletal:         General: No signs of injury.      Cervical back: Normal range of motion. No tenderness.      Left hip: Deformity, tenderness and bony tenderness present. Decreased range of motion. Decreased strength.   Lymphadenopathy:      Cervical: No cervical adenopathy.   Skin:     General: Skin is warm.      Capillary Refill: Capillary refill takes less than 2 seconds.      Coloration: Skin is not jaundiced.   Neurological:      General: No focal deficit present.      Mental Status: She is alert and oriented to person, place, and time.      Cranial Nerves: No cranial nerve deficit.      Motor: No weakness.   Psychiatric:         Behavior: Behavior normal.              Health Maintenance:  Health Maintenance   Topic Date Due    NonMedicare Preventative Exam  Never done    Influenza Vaccine (1) 12/22/2023 (Originally 07/01/2023)    Colonoscopy  01/20/2024 (Originally 05/30/2013)    Pneumococcal Vaccination, Age 8+ (1 of 2 - PCV) 11/21/2024 (Originally 05/31/1987)    Breast Cancer Screening  11/24/2023  Adult Tdap-Td  Discontinued    Hepatitis B Vaccine  Discontinued    Hepatitis C screening  Discontinued    Pap smear/HPV  Discontinued    HIV Screening  Discontinued    Shingles Vaccine  Discontinued    Covid-19 Vaccine  Discontinued        Assessment:    I have reviewed the most recent labs with the patient and all questions answered to patients satisfaction.       ICD-10-CM    1. UGIB (upper gastrointestinal bleed)  K92.2 Clinically without symptoms or evidence of recurrent disease. Continue current treatment plan as stable.       2. Acute left lumbar radiculopathy  M54.16 The patient's condition is unchanged from prior.  We have discussed whether there is need for any therapeutic changes and the patient states that they are tolerating  current treatments and will just continue with current lifestyle.        3. Lumbar radiculopathy, chronic  M54.16 The patient's condition is unchanged from prior.  We have discussed whether there is need for any therapeutic changes and the patient states that they are tolerating current treatments and will just continue with current lifestyle.       4. Pelvic pain  R10.2       5. Pain of left hip  M25.552 Condition stable will continue current therapy.           Orders Placed This Encounter    cyclobenzaprine (FLEXERIL) 10 mg Oral Tablet     Medications Discontinued During This Encounter   Medication Reason    cyclobenzaprine (FLEXERIL) 10 mg Oral Tablet Reorder    Halobetasol Propionate (ULTRAVATE) 0.05 % Ointment Medication Reconciliation    acetaminophen-codeine (TYLENOL #4) 300-60 mg Oral Tablet Medication Reconciliation        Follow up:  Return in about 4 months (around 03/21/2024).    This note was partially created using voice recognition software and is inherently subject to errors including those of syntax and "sound alike " substitutions which may escape proof reading.  In such instances, original meaning may be extrapolated by contextual derivation.    Lucia Gaskins, DO  11/22/2023, 11:46

## 2023-11-28 ENCOUNTER — Encounter (INDEPENDENT_AMBULATORY_CARE_PROVIDER_SITE_OTHER): Payer: Self-pay

## 2023-12-05 ENCOUNTER — Other Ambulatory Visit: Payer: Self-pay

## 2023-12-18 ENCOUNTER — Other Ambulatory Visit (INDEPENDENT_AMBULATORY_CARE_PROVIDER_SITE_OTHER): Payer: Self-pay | Admitting: Internal Medicine

## 2023-12-18 MED ORDER — TRAMADOL 50 MG TABLET
1.0000 | ORAL_TABLET | Freq: Four times a day (QID) | ORAL | 0 refills | Status: DC | PRN
Start: 2023-12-18 — End: 2024-01-11

## 2023-12-20 ENCOUNTER — Other Ambulatory Visit (INDEPENDENT_AMBULATORY_CARE_PROVIDER_SITE_OTHER): Payer: Self-pay | Admitting: Internal Medicine

## 2023-12-20 MED ORDER — CYANOCOBALAMIN (VIT B-12) 1,000 MCG/ML INJECTION SOLUTION
1000.0000 ug | INTRAMUSCULAR | 1 refills | Status: DC
Start: 2023-12-20 — End: 2024-06-04

## 2024-01-01 ENCOUNTER — Other Ambulatory Visit (INDEPENDENT_AMBULATORY_CARE_PROVIDER_SITE_OTHER): Payer: Self-pay | Admitting: Internal Medicine

## 2024-01-01 DIAGNOSIS — F320 Major depressive disorder, single episode, mild: Secondary | ICD-10-CM

## 2024-01-11 ENCOUNTER — Other Ambulatory Visit (INDEPENDENT_AMBULATORY_CARE_PROVIDER_SITE_OTHER): Payer: Self-pay | Admitting: Internal Medicine

## 2024-01-11 ENCOUNTER — Encounter (INDEPENDENT_AMBULATORY_CARE_PROVIDER_SITE_OTHER): Payer: Self-pay | Admitting: Internal Medicine

## 2024-01-11 DIAGNOSIS — M5416 Radiculopathy, lumbar region: Secondary | ICD-10-CM

## 2024-01-11 MED ORDER — TRAMADOL 50 MG TABLET
1.0000 | ORAL_TABLET | Freq: Four times a day (QID) | ORAL | 0 refills | Status: AC | PRN
Start: 2024-01-11 — End: ?

## 2024-01-11 MED ORDER — AMITRIPTYLINE 25 MG TABLET
25.0000 mg | ORAL_TABLET | Freq: Every evening | ORAL | 1 refills | Status: DC
Start: 2024-01-11 — End: 2024-05-28

## 2024-01-11 NOTE — Telephone Encounter (Signed)
 Ok to order Elavil 25 mg at bedtime

## 2024-01-11 NOTE — Telephone Encounter (Signed)
 Ok to order lumbar spine without contrast

## 2024-01-17 ENCOUNTER — Encounter (INDEPENDENT_AMBULATORY_CARE_PROVIDER_SITE_OTHER): Payer: Self-pay | Admitting: Internal Medicine

## 2024-01-17 ENCOUNTER — Other Ambulatory Visit (INDEPENDENT_AMBULATORY_CARE_PROVIDER_SITE_OTHER): Payer: Self-pay | Admitting: Internal Medicine

## 2024-01-17 DIAGNOSIS — M5416 Radiculopathy, lumbar region: Secondary | ICD-10-CM

## 2024-01-18 MED ORDER — PREGABALIN 100 MG CAPSULE
200.0000 mg | ORAL_CAPSULE | Freq: Two times a day (BID) | ORAL | 2 refills | Status: DC
Start: 2024-01-18 — End: 2024-04-15

## 2024-01-21 ENCOUNTER — Encounter (INDEPENDENT_AMBULATORY_CARE_PROVIDER_SITE_OTHER): Payer: Self-pay | Admitting: Internal Medicine

## 2024-01-21 ENCOUNTER — Ambulatory Visit (INDEPENDENT_AMBULATORY_CARE_PROVIDER_SITE_OTHER): Payer: Self-pay | Admitting: Internal Medicine

## 2024-01-21 DIAGNOSIS — M5416 Radiculopathy, lumbar region: Secondary | ICD-10-CM

## 2024-02-07 ENCOUNTER — Other Ambulatory Visit (INDEPENDENT_AMBULATORY_CARE_PROVIDER_SITE_OTHER): Payer: Self-pay | Admitting: Internal Medicine

## 2024-02-07 MED ORDER — TRAMADOL 50 MG TABLET
1.0000 | ORAL_TABLET | Freq: Four times a day (QID) | ORAL | 0 refills | Status: DC | PRN
Start: 2024-02-07 — End: 2024-02-29

## 2024-02-22 ENCOUNTER — Other Ambulatory Visit (INDEPENDENT_AMBULATORY_CARE_PROVIDER_SITE_OTHER): Payer: Self-pay | Admitting: Internal Medicine

## 2024-02-22 MED ORDER — PANTOPRAZOLE 40 MG TABLET,DELAYED RELEASE
40.0000 mg | DELAYED_RELEASE_TABLET | Freq: Two times a day (BID) | ORAL | 1 refills | Status: DC
Start: 2024-02-22 — End: 2024-07-28

## 2024-02-29 ENCOUNTER — Other Ambulatory Visit (INDEPENDENT_AMBULATORY_CARE_PROVIDER_SITE_OTHER): Payer: Self-pay | Admitting: Internal Medicine

## 2024-02-29 MED ORDER — TRAMADOL 50 MG TABLET
1.0000 | ORAL_TABLET | Freq: Four times a day (QID) | ORAL | 0 refills | Status: DC | PRN
Start: 2024-02-29 — End: 2024-04-02

## 2024-03-28 ENCOUNTER — Other Ambulatory Visit (INDEPENDENT_AMBULATORY_CARE_PROVIDER_SITE_OTHER): Payer: Self-pay | Admitting: Internal Medicine

## 2024-03-28 DIAGNOSIS — F320 Major depressive disorder, single episode, mild: Secondary | ICD-10-CM

## 2024-04-02 ENCOUNTER — Other Ambulatory Visit (INDEPENDENT_AMBULATORY_CARE_PROVIDER_SITE_OTHER): Payer: Self-pay | Admitting: Internal Medicine

## 2024-04-02 MED ORDER — TRAMADOL 50 MG TABLET
1.0000 | ORAL_TABLET | Freq: Four times a day (QID) | ORAL | 0 refills | Status: DC | PRN
Start: 2024-04-02 — End: 2024-05-06

## 2024-04-15 ENCOUNTER — Other Ambulatory Visit (INDEPENDENT_AMBULATORY_CARE_PROVIDER_SITE_OTHER): Payer: Self-pay | Admitting: Internal Medicine

## 2024-04-15 DIAGNOSIS — M5416 Radiculopathy, lumbar region: Secondary | ICD-10-CM

## 2024-04-15 MED ORDER — PREGABALIN 100 MG CAPSULE
200.0000 mg | ORAL_CAPSULE | Freq: Two times a day (BID) | ORAL | 2 refills | Status: AC
Start: 2024-04-15 — End: ?

## 2024-05-06 ENCOUNTER — Other Ambulatory Visit (INDEPENDENT_AMBULATORY_CARE_PROVIDER_SITE_OTHER): Payer: Self-pay | Admitting: Internal Medicine

## 2024-05-06 MED ORDER — TRAMADOL 50 MG TABLET
1.0000 | ORAL_TABLET | Freq: Four times a day (QID) | ORAL | 0 refills | Status: DC | PRN
Start: 2024-05-06 — End: 2024-05-30

## 2024-05-28 ENCOUNTER — Other Ambulatory Visit: Payer: Self-pay

## 2024-05-28 ENCOUNTER — Encounter (INDEPENDENT_AMBULATORY_CARE_PROVIDER_SITE_OTHER): Payer: Self-pay | Admitting: Internal Medicine

## 2024-05-28 ENCOUNTER — Ambulatory Visit: Payer: Self-pay | Attending: Internal Medicine | Admitting: Internal Medicine

## 2024-05-28 VITALS — BP 148/95 | HR 100 | Ht 70.0 in | Wt 169.6 lb

## 2024-05-28 DIAGNOSIS — M5416 Radiculopathy, lumbar region: Secondary | ICD-10-CM | POA: Insufficient documentation

## 2024-05-28 DIAGNOSIS — Z1231 Encounter for screening mammogram for malignant neoplasm of breast: Secondary | ICD-10-CM | POA: Insufficient documentation

## 2024-05-28 DIAGNOSIS — R29898 Other symptoms and signs involving the musculoskeletal system: Secondary | ICD-10-CM | POA: Insufficient documentation

## 2024-05-28 DIAGNOSIS — M25552 Pain in left hip: Secondary | ICD-10-CM | POA: Insufficient documentation

## 2024-05-28 DIAGNOSIS — D513 Other dietary vitamin B12 deficiency anemia: Secondary | ICD-10-CM | POA: Insufficient documentation

## 2024-05-28 DIAGNOSIS — Z0000 Encounter for general adult medical examination without abnormal findings: Secondary | ICD-10-CM | POA: Insufficient documentation

## 2024-05-28 DIAGNOSIS — E538 Deficiency of other specified B group vitamins: Secondary | ICD-10-CM | POA: Insufficient documentation

## 2024-05-28 DIAGNOSIS — Z13220 Encounter for screening for lipoid disorders: Secondary | ICD-10-CM | POA: Insufficient documentation

## 2024-05-28 DIAGNOSIS — I10 Essential (primary) hypertension: Secondary | ICD-10-CM | POA: Insufficient documentation

## 2024-05-28 DIAGNOSIS — R5383 Other fatigue: Secondary | ICD-10-CM | POA: Insufficient documentation

## 2024-05-28 MED ORDER — NEBIVOLOL 5 MG TABLET
5.0000 mg | ORAL_TABLET | Freq: Every day | ORAL | 2 refills | Status: DC
Start: 2024-05-28 — End: 2024-09-19

## 2024-05-28 NOTE — Nursing Note (Signed)
 Pt here for annual wellness visit. States her BP has been high since issues with her back. She wasn't able to take amitriptyline  it made her ill so she d/c. Pt states she is going to the pain clinic in [place] to see what they can do and get her off meds she doesn't like being on all the meds. Pt states she [facility] she has never had one. Pt had colonoscopy done by [facility] she said its been close to 10 years but not quite 10 years yet.

## 2024-05-28 NOTE — Progress Notes (Signed)
 INTERNAL MEDICINE, [facility]  [address]  [place] [address]  Operated by [facility]     Name: Stephanie Gallegos MRN:  [id number]   Date: 05/28/2024 Age: 58 y.o.       Chief Complaint:    Chief Complaint   Patient presents with    Annual Wellness Exam        History of Present Illness  Stephanie Gallegos is a 58 year old female with lumbar radiculopathy who presents with severe leg pain and weakness.    Stephanie Gallegos experiences severe pain originating from the lumbar region, specifically between L3 to L4 and L5 to S1, with radiation down the hip and front of the leg, described as feeling like 'shaving the bone of my shin.' There is significant weakness in the leg, requiring her to manually lift it to perform daily activities such as getting into a car or putting on pants. Stephanie Gallegos reports that after her last MRI, Dr. Tobie told her there was an issue at L3 to L4, and Stephanie Gallegos has received injections that provided temporary relief for about two weeks.    Stephanie Gallegos experiences numbness in her foot when wearing shoes, leading to a dragging sensation. The pain varies in nature, sometimes throbbing, sometimes stabbing, but consistently accompanied by tingling and nausea. Her blood pressure is elevated, which Stephanie Gallegos attributes to the pain. Her current medication regimen includes tramadol , which Stephanie Gallegos takes four times a day, and Stephanie Gallegos wants to discontinue it due to side effects and dependency concerns.    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 Stephanie Gallegos is aware of potential treatments like steroid injections or tape but has not pursued them extensively.    Her social history includes living with her partner Stephanie Gallegos, who has significant vision impairment following a stroke, and her son Stephanie Gallegos, who is expecting a child and works in tax services. Stephanie Gallegos expresses frustration with her current situation, including the impact of her condition on her  mood and interactions with Stephanie Gallegos.       Past Medical History:  Past Medical History:   Diagnosis Date    B12 deficiency     Vitamin D deficiency          Past Surgical History:   Procedure Laterality Date    HX OTHER      toe removed      Current Outpatient Medications   Medication Sig    buPROPion  (WELLBUTRIN  SR) 100 mg Oral tablet sustained-release 12 hr Take 1 tablet by mouth twice daily    cyanocobalamin  (VITAMIN B12) 1,000 mcg/mL Injection Solution Inject 1 mL (1,000 mcg total) under the skin Every 30 days    cyclobenzaprine  (FLEXERIL ) 10 mg Oral Tablet Take 1 Tablet (10 mg total) by mouth Every 8 hours as needed for Muscle spasms    pantoprazole  (PROTONIX ) 40 mg Oral Tablet, Delayed Release (E.C.) Take 1 Tablet (40 mg total) by mouth Twice a day before meals    pregabalin  (LYRICA ) 100 mg Oral Capsule Take 2 Capsules (200 mg total) by mouth Twice daily    sertraline  (ZOLOFT ) 100 mg Oral Tablet Take 1 Tablet (100 mg total) by mouth Once a day    traMADoL  (ULTRAM ) 50 mg Oral Tablet Take 1-2 Tablets (50-100 mg total) by mouth Every 6 hours as needed for Pain     Allergies[1]    Family History:  Family Medical History:  None           Social History:   Tobacco Use History[2]  Social History     Substance and Sexual Activity   Alcohol Use Yes    Comment: social     Social History     Occupational History    Not on file       Review of Systems:  Review of systems as discussed in HPI    Problem List:  Problem List[3]    Physical Examination:  BP (!) 148/95 (Site: Left Arm, Patient Position: Sitting)   Pulse 100   Ht 1.778 m (5' 10)   Wt 76.9 kg (169 lb 9.6 oz)   LMP  (LMP Unknown)   SpO2 97%   BMI 24.34 kg/m       Physical Exam  Vitals and nursing note reviewed.   Constitutional:       General: Stephanie Gallegos is not in acute distress.     Appearance: Normal appearance.   HENT:      Head: Normocephalic and atraumatic.      Right Ear: Tympanic membrane normal.      Left Ear: Tympanic membrane normal.      Nose: Nose  normal.      Mouth/Throat:      Mouth: Mucous membranes are moist.      Pharynx: Oropharynx is clear.   Eyes:      General:         Right eye: No discharge.         Left eye: No discharge.      Conjunctiva/sclera: Conjunctivae normal.      Pupils: Pupils are equal, round, and reactive to light.   Cardiovascular:      Rate and Rhythm: Normal rate and regular rhythm.      Pulses:           Carotid pulses are 0 on the right side and 0 on the left side.       Popliteal pulses are 2+ on the right side and 2+ on the left side.        Dorsalis pedis pulses are 2+ on the right side and 2+ on the left side.      Heart sounds: Normal heart sounds.   Pulmonary:      Effort: Pulmonary effort is normal.      Breath sounds: Normal breath sounds. No wheezing, rhonchi or rales.   Abdominal:      General: Bowel sounds are normal. There is no distension.      Palpations: Abdomen is soft.      Tenderness: There is abdominal tenderness in the left lower quadrant. There is guarding. There is no rebound. Positive signs include obturator sign.   Musculoskeletal:         General: No signs of injury.      Cervical back: Normal range of motion. No tenderness.      Left hip: Deformity, tenderness and bony tenderness present. Decreased range of motion. Decreased strength.   Lymphadenopathy:      Cervical: No cervical adenopathy.   Skin:     General: Skin is warm.      Capillary Refill: Capillary refill takes less than 2 seconds.      Coloration: Skin is not jaundiced.   Neurological:      General: No focal deficit present.      Mental Status: Stephanie Gallegos is alert and oriented to person, place, and time.      Cranial  Nerves: No cranial nerve deficit.      Motor: No weakness.   Psychiatric:         Behavior: Behavior normal.              Health Maintenance:  Health Maintenance   Topic Date Due    NonMedicare Preventative Exam  Never done    Colonoscopy  Never done    Breast Cancer Screening  Never done    Pneumococcal Vaccination, Age 44+ (1 of 2 - PCV)  11/21/2024 (Originally 05/31/1987)    Influenza Vaccine (1) 06/30/2024    Adult Tdap-Td  Discontinued    Hepatitis B Vaccine  Discontinued    Hepatitis C screening  Discontinued    Pap smear/HPV  Discontinued    HIV Screening  Discontinued    Shingles Vaccine  Discontinued    Covid-19 Vaccine  Discontinued        Results  RADIOLOGY  Lumbar spine MRI: L3 to L4 disc involvement, L5 to S1 unspecified nerve involvement    DIAGNOSTIC  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   No orders of the defined types were placed in this encounter.    Medications Discontinued During This Encounter   Medication Reason    tirzepatide  (MOUNJARO ) 2.5 mg/0.5 mL Subcutaneous Pen Injector Insurance Coverage    amitriptyline  (ELAVIL ) 25 mg Oral Tablet Medication Reconciliation                     Assessment & Plan  Lumbar radiculopathy with left leg weakness and pain  Chronic lumbar radiculopathy with significant left leg weakness and pain, characterized by throbbing, stabbing, and tingling sensations exacerbated by movement. Notable muscle wasting and decreased functionality of the left leg. Stephanie Gallegos is hesitant about surgical intervention due to uncertainty about outcomes and prefers to avoid back surgery unless necessary.  - Refer to Dr. Tanda, neurosurgeon, for further evaluation and management.  - Discuss potential for aggressive therapy post-surgery if weakness persists.    Chronic pain syndrome with medication side effects (nausea, vomiting, fatigue)  Chronic pain syndrome with significant medication side effects, including nausea, vomiting, and fatigue. Stephanie Gallegos desires to reduce medication use due to these side effects and the impact on quality of life. Current regimen includes Tramadol  up to four times daily and Lyrica , which has contributed to weight gain. Stephanie Gallegos is interested in weaning off medications if pain can be managed effectively.  - Discuss potential strategies for weaning off medications  once pain is controlled.  - Educate on managing medication side effects and explore alternative pain management options.    Hypertension  Hypertension likely exacerbated by chronic pain and stress, with elevated blood pressure readings. A new medication, Bystolic , is introduced to help manage blood pressure and heart rate.  - Prescribe Bystolic  5 mg daily to manage blood pressure and heart rate.    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 Stephanie Gallegos is aware of the cosmetic appearance but is not interested in surgical removal due to the risk of recurrence and enlargement.  - Discuss intralesional steroid injections as a treatment option.  - Consider Cordrand tape for long-term reduction of dermatofibroma size.       Follow up:  No follow-ups on file.    This note was partially created using voice recognition software and is inherently subject to errors including those of syntax and sound alike  substitutions which may escape proof reading.  In such instances, original meaning may be extrapolated by contextual derivation.    This note was created with assistance from [employer] via capture of conversational audio. Consent was obtained from the patient and all parties present prior to recording.      Krystal DELENA Sharps, DO  05/28/2024, 08:55         [1] No Known Allergies  [2]   Social History  Tobacco Use   Smoking Status Every Day    Current packs/day: 1.00    Types: Cigarettes    Passive exposure: Never   Smokeless Tobacco Never   [3]   Patient Active Problem List  Diagnosis    Pelvic pain    Pain of left hip    Acute left lumbar radiculopathy    Left leg weakness    Lumbar radiculopathy, chronic    High risk medication use    UGIB (upper gastrointestinal bleed)

## 2024-05-30 ENCOUNTER — Other Ambulatory Visit (INDEPENDENT_AMBULATORY_CARE_PROVIDER_SITE_OTHER): Payer: Self-pay | Admitting: Internal Medicine

## 2024-05-30 MED ORDER — TRAMADOL 50 MG TABLET
1.0000 | ORAL_TABLET | Freq: Four times a day (QID) | ORAL | 0 refills | Status: DC | PRN
Start: 2024-05-30 — End: 2024-07-01

## 2024-06-04 ENCOUNTER — Other Ambulatory Visit (INDEPENDENT_AMBULATORY_CARE_PROVIDER_SITE_OTHER): Payer: Self-pay | Admitting: Internal Medicine

## 2024-06-11 ENCOUNTER — Encounter (INDEPENDENT_AMBULATORY_CARE_PROVIDER_SITE_OTHER): Payer: Self-pay | Admitting: Internal Medicine

## 2024-06-11 ENCOUNTER — Ambulatory Visit (INDEPENDENT_AMBULATORY_CARE_PROVIDER_SITE_OTHER): Payer: Self-pay | Admitting: Internal Medicine

## 2024-06-11 DIAGNOSIS — D649 Anemia, unspecified: Secondary | ICD-10-CM

## 2024-06-11 DIAGNOSIS — I10 Essential (primary) hypertension: Secondary | ICD-10-CM

## 2024-06-11 DIAGNOSIS — Z0000 Encounter for general adult medical examination without abnormal findings: Secondary | ICD-10-CM

## 2024-06-11 DIAGNOSIS — E538 Deficiency of other specified B group vitamins: Secondary | ICD-10-CM

## 2024-06-11 DIAGNOSIS — R5383 Other fatigue: Secondary | ICD-10-CM

## 2024-06-11 DIAGNOSIS — D513 Other dietary vitamin B12 deficiency anemia: Secondary | ICD-10-CM

## 2024-06-11 DIAGNOSIS — Z13220 Encounter for screening for lipoid disorders: Secondary | ICD-10-CM

## 2024-06-11 LAB — COMPREHENSIVE METABOLIC PANEL, NON-FASTING
CREATININE: 0.82
ESTIMATED GLOMERULAR FILTRATION RATE: 84

## 2024-06-11 LAB — LIPID PANEL
CHOLESTEROL: 197
HDL-CHOLESTEROL: 70
LDL (CALCULATED): 112
TRIGLYCERIDES: 83
VLDL (CALCULATED): 15

## 2024-06-18 ENCOUNTER — Ambulatory Visit: Attending: Internal Medicine

## 2024-06-18 ENCOUNTER — Other Ambulatory Visit: Payer: Self-pay

## 2024-06-18 DIAGNOSIS — D649 Anemia, unspecified: Secondary | ICD-10-CM | POA: Insufficient documentation

## 2024-06-18 LAB — FERRITIN: FERRITIN: 12 ng/mL (ref 11–307)

## 2024-06-18 LAB — IRON TRANSFERRIN AND TIBC
IRON (TRANSFERRIN) SATURATION: 9 % — ABNORMAL LOW (ref 15–50)
IRON: 47 ug/dL — ABNORMAL LOW (ref 50–212)
TOTAL IRON BINDING CAPACITY: 508 ug/dL — ABNORMAL HIGH (ref 250–450)
TRANSFERRIN: 363 mg/dL — ABNORMAL HIGH (ref 203–362)
UIBC: 461 ug/dL — ABNORMAL HIGH (ref 130–375)

## 2024-06-18 NOTE — Progress Notes (Signed)
 labs

## 2024-06-19 ENCOUNTER — Ambulatory Visit (INDEPENDENT_AMBULATORY_CARE_PROVIDER_SITE_OTHER): Payer: Self-pay | Admitting: Internal Medicine

## 2024-06-19 DIAGNOSIS — D649 Anemia, unspecified: Secondary | ICD-10-CM

## 2024-06-19 MED ORDER — POLYSACCHARIDE IRON COMPLEX 150 MG IRON CAPSULE
150.0000 mg | ORAL_CAPSULE | Freq: Two times a day (BID) | ORAL | 1 refills | Status: DC
Start: 2024-06-19 — End: 2024-07-29

## 2024-06-20 ENCOUNTER — Encounter (INDEPENDENT_AMBULATORY_CARE_PROVIDER_SITE_OTHER): Payer: Self-pay | Admitting: Internal Medicine

## 2024-06-20 LAB — PROTEIN ELECTROPHORESIS, SERUM (SPEP)
ALBUMIN: 4.2 g/dL (ref 3.5–5.0)
PROTEIN TOTAL: 7.5 g/dL (ref 6.0–7.9)

## 2024-07-01 ENCOUNTER — Encounter (INDEPENDENT_AMBULATORY_CARE_PROVIDER_SITE_OTHER): Payer: Self-pay | Admitting: Surgery

## 2024-07-01 ENCOUNTER — Ambulatory Visit (INDEPENDENT_AMBULATORY_CARE_PROVIDER_SITE_OTHER): Admitting: Surgery

## 2024-07-01 ENCOUNTER — Other Ambulatory Visit: Payer: Self-pay

## 2024-07-01 ENCOUNTER — Other Ambulatory Visit (INDEPENDENT_AMBULATORY_CARE_PROVIDER_SITE_OTHER): Payer: Self-pay | Admitting: Internal Medicine

## 2024-07-01 DIAGNOSIS — D649 Anemia, unspecified: Secondary | ICD-10-CM

## 2024-07-01 MED ORDER — SODIUM SUL 1.479 GRAM-POTAS CH 0.188 GRAM-MAGNES SUL 0.225 GRAM TABLET: ORAL_TABLET | ORAL | 0 refills | Status: DC

## 2024-07-01 MED ORDER — TRAMADOL 50 MG TABLET
1.0000 | ORAL_TABLET | Freq: Four times a day (QID) | ORAL | 0 refills | Status: DC | PRN
Start: 2024-07-01 — End: 2024-07-29

## 2024-07-01 NOTE — Progress Notes (Signed)
 GENERAL SURGERY, [facility]  [address]  [place] [address]    History and Physical    Name: Stephanie Gallegos MRN:  [id number]   Date: 07/01/2024 DOB:  Dec 26, 1965 (58 y.o.)              Reason for Visit: Anemia    History of Present Illness  Stephanie Gallegos presents today for EGD and colonoscopy because of anemia.  The patient takes iron  supplement twice a day.  She denies noticing any blood in his stool.  The patient's last colonoscopy was approximately 6 years ago.  The patient had an episode last year of coffee-ground emesis for which she underwent EGD with Dr. Tobie which showed an ulcer in the GE junction.  The patient takes pain medications for chronic degenerative joint disease.  She is seeing pain management specialist for this.    Negative diabetes, blood thinner, family history colon cancer      Review of the result(s) of each unique test:  Patient underwent diagnostic testing ( none ) prior to this dates visit.  I have personally reviewed the results and that serves as a component of the medical decision making for this encounter       Review of prior external note(s) from each unique source:  Patients referral to this office including a recent assessment by the referring provider.  This was reviewed by me for this unique office visit for the indication and intent of the referral as well as any pertinent medical or surgical history relevant to the patients independent evaluation by me today.      Patient History  Past Medical History:   Diagnosis Date    Anxiety     B12 deficiency     DDD (degenerative disc disease), lumbar     Vitamin D deficiency          Past Surgical History:   Procedure Laterality Date    HX OTHER      toe removed         Current Outpatient Medications   Medication Sig    buPROPion  (WELLBUTRIN  SR) 100 mg Oral tablet sustained-release 12 hr Take 1 tablet by mouth twice daily    cyanocobalamin  (VITAMIN B12) 1,000 mcg/mL Injection Solution INJECT 1 ML  (CC) SUBCUTANEOUSLY ONCE EVERY MONTH    cyclobenzaprine  (FLEXERIL ) 10 mg Oral Tablet Take 1 Tablet (10 mg total) by mouth Every 8 hours as needed for Muscle spasms    nebivoloL  (BYSTOLIC ) 5 mg Oral Tablet Take 1 Tablet (5 mg total) by mouth Daily    pantoprazole  (PROTONIX ) 40 mg Oral Tablet, Delayed Release (E.C.) Take 1 Tablet (40 mg total) by mouth Twice a day before meals    polysaccharide iron  complex (FERREX 150) 150 mg iron  Oral Capsule Take 1 Capsule (150 mg total) by mouth Twice daily    pregabalin  (LYRICA ) 100 mg Oral Capsule Take 2 Capsules (200 mg total) by mouth Twice daily    sertraline  (ZOLOFT ) 100 mg Oral Tablet Take 1 Tablet (100 mg total) by mouth Once a day    sod sulf-pot chloride-mag sulf 1.479-0.188- 0.225 gram Oral Tablet Take 12 pills at 10-10:30 am. Take 12 pills at 6-6:30pm. Drink plenty of fluids all day/evening.    traMADoL  (ULTRAM ) 50 mg Oral Tablet Take 1-2 Tablets (50-100 mg total) by mouth Every 6 hours as needed for Pain     Allergies[1]  Family Medical History:       Problem Relation (  Age of Onset)    Breast Cancer Mother            Social History[2]         Physical Examination:  Vitals:    07/01/24 1055   BP: (!) 154/97   Pulse: 80   Temp: 36.6 C (97.8 F)   SpO2: 97%   Weight: 78.7 kg (173 lb 6.4 oz)   Height: 1.778 m (5' 10)   BMI: 24.88        General: appropriate for age. in no acute distress.    Vital signs are present above and have been reviewed by me     HEENT: Atraumatic, Normocephalic. PERRLA. EOMI. Nose clear. Throat clear    Lungs: Nonlabored breathing with symmetric expansion. Clear to auscultation bilaterally    Heart:Regular wth respect to rate and rythmn.    Abdomen:Soft. Nontender. Nondistended and benign    Extremities: Grossly normal. No major deformities     Neuro:  Grossly normal motor and sensory function    Psychiatric: Alert and oriented to person, place, and time. affect appropriate      Assessment and Plan  EGD and colonoscopy scheduled for 10/08/2024 at  7:00 a.m.    Discussed indications, risks and benefits of esophagogastroduodenoscopy and colonoscopy with the patient.  Discussed the possibility of polypectomy, biopsies, and possible repeat examinations.  Risks include bleeding, sedation risks, possibility of missed diagnosis of polyp or malignancy, and remote possibilities of perforation and death.  All questions were answered, and informed consent was clearly obtained.      Follow Up:  No follow-ups on file.      ICD-10-CM    1. Anemia, unspecified type  D64.9           Frantz Quattrone B Reeya Bound, MD ,MBA,FACS    I appreciate the opportunity to be involved in the care of your patients.  If you have any questions or concerns regarding this encounter, please do not hesitate to contact me at your convenience.      This note may have been partially generated using MModal Fluency Direct system, and there may be some incorrect words, spellings, and punctuation that were not noted in checking the note before saving, though effort was made to avoid such errors.               [1] No Known Allergies  [2]   Social History  Tobacco Use    Smoking status: Every Day     Current packs/day: 1.00     Types: Cigarettes     Passive exposure: Never    Smokeless tobacco: Never   Substance Use Topics    Alcohol use: Yes     Comment: social    Drug use: Never

## 2024-07-03 ENCOUNTER — Encounter (INDEPENDENT_AMBULATORY_CARE_PROVIDER_SITE_OTHER): Payer: Self-pay | Admitting: Internal Medicine

## 2024-07-15 ENCOUNTER — Ambulatory Visit (INDEPENDENT_AMBULATORY_CARE_PROVIDER_SITE_OTHER): Admitting: Surgery

## 2024-07-28 ENCOUNTER — Other Ambulatory Visit (INDEPENDENT_AMBULATORY_CARE_PROVIDER_SITE_OTHER): Payer: Self-pay | Admitting: Internal Medicine

## 2024-07-28 MED ORDER — PANTOPRAZOLE 40 MG TABLET,DELAYED RELEASE
40.0000 mg | DELAYED_RELEASE_TABLET | Freq: Two times a day (BID) | ORAL | 1 refills | Status: DC
Start: 2024-07-28 — End: 2024-08-04

## 2024-07-29 ENCOUNTER — Other Ambulatory Visit (INDEPENDENT_AMBULATORY_CARE_PROVIDER_SITE_OTHER): Payer: Self-pay | Admitting: Internal Medicine

## 2024-07-29 MED ORDER — TRAMADOL 50 MG TABLET
1.0000 | ORAL_TABLET | Freq: Four times a day (QID) | ORAL | 0 refills | Status: DC | PRN
Start: 2024-07-29 — End: 2024-08-19

## 2024-07-29 MED ORDER — POLYSACCHARIDE IRON COMPLEX 150 MG IRON CAPSULE
150.0000 mg | ORAL_CAPSULE | Freq: Two times a day (BID) | ORAL | 1 refills | Status: AC
Start: 2024-07-29 — End: ?

## 2024-08-02 ENCOUNTER — Other Ambulatory Visit (INDEPENDENT_AMBULATORY_CARE_PROVIDER_SITE_OTHER): Payer: Self-pay | Admitting: Internal Medicine

## 2024-08-05 ENCOUNTER — Other Ambulatory Visit (INDEPENDENT_AMBULATORY_CARE_PROVIDER_SITE_OTHER): Payer: Self-pay | Admitting: Internal Medicine

## 2024-08-19 ENCOUNTER — Other Ambulatory Visit (INDEPENDENT_AMBULATORY_CARE_PROVIDER_SITE_OTHER): Payer: Self-pay | Admitting: Internal Medicine

## 2024-08-19 MED ORDER — TRAMADOL 50 MG TABLET: 1.0000 | ORAL_TABLET | Freq: Four times a day (QID) | ORAL | 0 refills | Status: DC | PRN

## 2024-08-27 NOTE — Telephone Encounter (Signed)
 Nurse Follow Up Call Note:     Date/Time: Wed 08/27/2024 11:43 AM  Caller: Madelin Bohr, RN  Patient: Stephanie Gallegos [birth year]     Note:  Two patient identifiers confirmed.   Post Surgical day 6    [name] is a 58 y.o. year old  s/p let L4-5 foraminotomy/lateral recess decompression on 08/21/2024 with Dr Reubin.     Patient reports pain 4/10, managed with prescribed medication or OTC medications. No new or worsening symptoms. Has not needed norco, but uses muscle relaxer sparingly. States she still has some nerve pain. Discussed healing process.    Denies fever, chills, drainage, or redness at incision site. Reinforced post op instructions, including wound care, pain management and infection symptoms.      Patient aware of follow up appointment on 09/30/2024. Advised to call [phone] for any questions or concerns. Patient verbalizes understanding.  Electronically signed by: Tammy Karna Bohr, RN 08/27/2024 11:43 AM

## 2024-09-19 ENCOUNTER — Other Ambulatory Visit (INDEPENDENT_AMBULATORY_CARE_PROVIDER_SITE_OTHER): Payer: Self-pay | Admitting: Internal Medicine

## 2024-09-29 ENCOUNTER — Other Ambulatory Visit (INDEPENDENT_AMBULATORY_CARE_PROVIDER_SITE_OTHER): Payer: Self-pay | Admitting: Internal Medicine

## 2024-09-29 MED ORDER — TRAMADOL 50 MG TABLET: 1.0000 | ORAL_TABLET | Freq: Four times a day (QID) | ORAL | 0 refills | Status: AC | PRN

## 2024-09-30 ENCOUNTER — Ambulatory Visit (INDEPENDENT_AMBULATORY_CARE_PROVIDER_SITE_OTHER): Payer: Self-pay | Admitting: Internal Medicine

## 2024-10-02 ENCOUNTER — Ambulatory Visit (INDEPENDENT_AMBULATORY_CARE_PROVIDER_SITE_OTHER): Admitting: Internal Medicine

## 2024-10-04 ENCOUNTER — Other Ambulatory Visit: Payer: Self-pay

## 2024-10-08 ENCOUNTER — Encounter (HOSPITAL_COMMUNITY)
Admission: RE | Disposition: A | Discharge status: Home / Self Care | Payer: Self-pay | Source: Ambulatory Visit | Attending: Surgery

## 2024-10-08 ENCOUNTER — Ambulatory Visit (HOSPITAL_COMMUNITY)

## 2024-10-08 ENCOUNTER — Encounter (HOSPITAL_COMMUNITY): Payer: Self-pay | Admitting: Surgery

## 2024-10-08 ENCOUNTER — Ambulatory Visit (HOSPITAL_COMMUNITY): Admitting: Surgery

## 2024-10-08 ENCOUNTER — Other Ambulatory Visit: Payer: Self-pay

## 2024-10-08 ENCOUNTER — Ambulatory Visit
Admission: RE | Admit: 2024-10-08 | Discharge: 2024-10-08 | Disposition: A | Discharge status: Home / Self Care | Source: Ambulatory Visit | Attending: Surgery | Admitting: Surgery

## 2024-10-08 DIAGNOSIS — M1990 Unspecified osteoarthritis, unspecified site: Secondary | ICD-10-CM | POA: Insufficient documentation

## 2024-10-08 DIAGNOSIS — K449 Diaphragmatic hernia without obstruction or gangrene: Secondary | ICD-10-CM | POA: Insufficient documentation

## 2024-10-08 DIAGNOSIS — K2100 Gastro-esophageal reflux disease with esophagitis, without bleeding: Secondary | ICD-10-CM | POA: Insufficient documentation

## 2024-10-08 DIAGNOSIS — K2950 Unspecified chronic gastritis without bleeding: Secondary | ICD-10-CM | POA: Insufficient documentation

## 2024-10-08 DIAGNOSIS — D649 Anemia, unspecified: Secondary | ICD-10-CM | POA: Insufficient documentation

## 2024-10-08 HISTORY — DX: Anemia, unspecified: D64.9

## 2024-10-08 SURGERY — GASTROSCOPY WITH BIOPSY
Anesthesia: General | Wound class: Clean Contaminated Wounds-The respiratory, GI, Genital, or urinary

## 2024-10-08 MED ORDER — PROPOFOL 10 MG/ML INTRAVENOUS EMULSION
INTRAVENOUS | Status: AC
  Filled 2024-10-08: qty 20

## 2024-10-08 MED ORDER — PROPOFOL 10 MG/ML INTRAVENOUS EMULSION
Freq: Once | INTRAVENOUS | Status: DC | PRN
  Administered 2024-10-08: 10 mL via INTRAVENOUS
  Administered 2024-10-08: 120 mL via INTRAVENOUS
  Administered 2024-10-08: 40 mL via INTRAVENOUS
  Administered 2024-10-08: 30 mL via INTRAVENOUS
  Administered 2024-10-08: 10 mL via INTRAVENOUS
  Administered 2024-10-08 (×4): 20 mL via INTRAVENOUS

## 2024-10-08 MED ORDER — LACTATED RINGERS INTRAVENOUS SOLUTION: INTRAVENOUS | Status: DC | PRN

## 2024-10-08 SURGICAL SUPPLY — 3 items
CLEANER INSTRUMENT PRE-KLENZ 13.5 OZ (MISCELLANEOUS PT CARE ITEMS) ×1 IMPLANT
FORCEPS BIOPSY LRG CPC NEEDLE 240CM 2.2MM RJ 3 DISP ORNG (ENDOSCOPIC SUPPLIES) IMPLANT
VALVE AIR/H20 DEFENDO BUTTON KIT SUCT BIOPSY STRL DISP (ENDOSCOPIC SUPPLIES) IMPLANT

## 2024-10-08 NOTE — Anesthesia Preprocedure Evaluation (Signed)
 ANESTHESIA PRE-OP EVALUATION  Planned Procedure: EGD WITH BIOPSY  COLONOSCOPY  Review of Systems     anesthesia history negative     patient summary reviewed          Pulmonary   current smoker and Denies smoking in last 24  hours,   Cardiovascular    No peripheral edema,  Exercise Tolerance: > or = 4 METS        GI/Hepatic/Renal   negative GI/hepatic/renal ROS,         Endo/Other    anemia,      Neuro/Psych/MS    anxiety     Cancer    negative hematology/oncology ROS,                     Physical Assessment      Airway       Mallampati: II    TM distance: >3 FB    Neck ROM: full  Mouth Opening: good.  No Facial hair  No Beard  No endotracheal tube present  No Tracheostomy present    Dental       Dentition intact             Pulmonary    Breath sounds clear to auscultation  (-) no rhonchi, no decreased breath sounds, no wheezes, no rales and no stridor     Cardiovascular    Rhythm: regular  Rate: Normal  (-) no friction rub, carotid bruit is not present, no peripheral edema and no murmur     Other findings              Plan  ASA 2     Planned anesthesia type: general     total intravenous anesthesia            SLEEP APNEA  Education provided regarding risk of obstructive sleep apnea        Intravenous induction       Anesthetic plan and risks discussed with patient  signed consent obtained          Patient's NPO status is appropriate for Anesthesia.           Plan discussed with CRNA.

## 2024-10-08 NOTE — OR Surgeon (Signed)
 This has going to that has last that is crazy   [facility]      Patient Name: Stephanie Gallegos, Stephanie Gallegos Number: [id number]  Date of Service: 10/08/2024   Date of Birth: April 23, 1966      Pre-Operative Diagnosis: Anemia, unspecified type [D64.9]     Post-Operative Diagnosis: Moderate Hiatal Hernia  Grade B Esophagitis  Mild Bile Reflux  Normal Colonoscopy    Procedure(s)/Description:  EGD WITH BIOPSY: 43239 (CPT)    COLONOSCOPY: 54621 (CPT)       Attending Surgeon: Amaryllis KATHEE Denise, MD     Anesthesia:  CRNA: Cleotilde Annabella PARAS, APRN,CRNA    Anesthesia Type: .General     Specimens Removed:   ID Type Source Tests Collected by Time Destination   1 : Gastric Antrum Bx Tissue Antrum SURGICAL PATHOLOGY SPECIMEN Amelie Caracci, Amaryllis KATHEE, MD 10/08/2024 [phone]      Order Name Source Comment Collection Info Order Time   SURGICAL PATHOLOGY SPECIMEN Antrum Pre-op diagnosis:  Anemia, unspecified type [D64.9] Collected By: Denise Amaryllis KATHEE, MD 10/08/2024  7:40 AM     Release to patient   Manual release only          Reason for preventing immediate release   Reasonable likelihood of causing patient harm            The patient indicates that they have read and understood the preoperative EGD with biopsy and colonoscopy consent form. The benefits, risks and alternatives to the procedure were discussed. I specifically discussed the risk of bleeding and/or perforation requiring operation.The patient indicates they have no further question and wish to proceed. Informed consent was obtained from the patient and/or medical power of attorney.  The patient was brought in to the endoscopy suite and placed on the stretcher in the left lateral decubitus position. The video gastroscope was then inserted into the mouth, down the esophagus and into the stomach after adequate IV and topical anesthetic was provided. The stomach was insufflated with air and examination of the stomach was performed. Antral biopsy was obtained and sent to the  Pathology department for microscopic examination. Hemostasis was well obtained.  The scope was advanced to the pyloric channel.  The pylorus was cannulated and the scope was advanced into the duodenum without any difficulty. Examination of the first and second portions of the duodenum was performed and the findings were noted as above. The scope was withdrawn back into the stomach in which an extensive examination was performed with the above noted findings. Retroflexion of the scope was performed which gave good visualization of the proximal stomach and GE junction from below. The scope was pulled back up into the proximal stomach and GE junction, with the above noted findings.  The scope was withdrawn back up into the esophagus and out the mouth without any difficulty. The patient tolerated the procedure well. No complications were encountered.   There were no unplanned events.  EKG, pulse, pulse oximetry and blood pressure were monitored throughout the entire procedure.  The patient was positioned on the stretcher in the left lateral decubitus position. After IV sedation was given, full finger digital rectal examination was performed with a circumferential sweep of the distal rectal mucosa. Subsequently, the flexible colonoscope was inserted into the rectum and passed without any difficulty. The colonoscope was then advanced up into the sigmoid colon, descending colon, transverse colon, right colon and cecum without any difficulty. Gross examination of each section of the colon was performed. Cecal intubation  was achieved and the appendiceal orifice and ileocecal valve were identified. The operative findings were noted as described above. The colonoscope was withdrawn carefully examining the mucosa as the scope was being extracted with particular attention paid to the proximal sides of folds, flexures, bends and rectal valves. At approximately 10 cm. from the anal verge, the colonoscope was retroflexed to fully  examine the distal rectum. The colonoscope was removed and a repeat digital rectal examination was performed at the completion of the procedure. The patient tolerated the procedure well. No intraoperative complications were encountered.  EKG, pulse, pulse oximetry and blood pressure were monitored throughout the entire procedure.  There were no unplanned events.    The patient was instructed to contact me if they have any problems with their stomach and/or colon such as nausea, vomiting, bleeding, pain or changes in bowel habits. They understood and agreed to do so.    The patient will not need another screening colonoscopy for 10 years which is according to ASGE guidelines. However, if in the future the patient has any problems with abdominal pain, changes in bowel habits, blood in stool, etc., then they should contact me because they may be a candidate for diagnostic colonoscopy before the 10 year time limit.  Neeti Knudtson B Yen Wandell, MD,MBA,FACS

## 2024-10-08 NOTE — H&P (Signed)
 [facility]  General Surgery  History and Physical    Date of Service:  10/08/2024  Stephanie, Gallegos, 58 y.o. female  Date of Admission:  10/08/2024  Date of Birth:  1965-11-18  PCP: Stephanie DELENA Sharps, DO    Reason for admission:  EGD and colonoscopy    HPI:  Stephanie Gallegos is a 6 y.o. White female who is admitted for Anemia, unspecified type [D64.9]     [name] presents today for EGD and colonoscopy because of anemia.  The patient takes iron  supplement twice a day.  She denies noticing any blood in his stool.  The patient's last colonoscopy was approximately 6 years ago.  The patient had an episode last year of coffee-ground emesis for which she underwent EGD with Dr. Tobie which showed an ulcer in the GE junction.  The patient takes pain medications for chronic degenerative joint disease.  She is seeing pain management specialist for this.     Negative diabetes, blood thinner, family history colon cancer        Review of the result(s) of each unique test:  Patient underwent diagnostic testing ( none ) prior to this dates visit.  I have personally reviewed the results and that serves as a component of the medical decision making for this encounter        Review of prior external note(s) from each unique source:  Patients referral to this office including a recent assessment by the referring provider.  This was reviewed by me for this unique office visit for the indication and intent of the referral as well as any pertinent medical or surgical history relevant to the patients independent evaluation by me today.    Past Medical History:   Diagnosis Date    Anemia, unspecified     Anxiety     B12 deficiency     DDD (degenerative disc disease), lumbar     Vitamin D deficiency       Past Surgical History:   Procedure Laterality Date    HX BACK SURGERY      HX OTHER      toe removed      Social History[1]    Family Medical History:       Problem Relation (Age of Onset)    Breast Cancer Mother            Medications Prior to Admission       Prescriptions    buPROPion  (WELLBUTRIN  SR) 100 mg Oral tablet sustained-release 12 hr    Take 1 tablet by mouth twice daily    cyanocobalamin  (VITAMIN B12) 1,000 mcg/mL Injection Solution    INJECT 1 ML (CC) SUBCUTANEOUSLY ONCE EVERY MONTH    cyclobenzaprine  (FLEXERIL ) 10 mg Oral Tablet    Take 1 Tablet (10 mg total) by mouth Every 8 hours as needed for Muscle spasms    nebivoloL  (BYSTOLIC ) 5 mg Oral Tablet    Take 1 tablet by mouth once daily    pantoprazole  (PROTONIX ) 40 mg Oral Tablet, Delayed Release (E.C.)    TAKE 1 TABLET BY MOUTH TWICE DAILY BEFORE MEAL(S)    polysaccharide iron  complex (FERREX 150) 150 mg iron  Oral Capsule    Take 1 Capsule (150 mg total) by mouth Twice daily    pregabalin  (LYRICA ) 100 mg Oral Capsule    Take 2 Capsules (200 mg total) by mouth Twice daily    sertraline  (ZOLOFT ) 100 mg Oral Tablet    Take 1 Tablet (100  mg total) by mouth Daily    traMADoL  (ULTRAM ) 50 mg Oral Tablet    Take 1-2 Tablets (50-100 mg total) by mouth Every 6 hours as needed for Pain           Allergies[2]       Patient Vitals for the past 24 hrs:   BP Temp Pulse Resp SpO2 Height Weight   10/08/24 0652 (!) 134/93 36.9 C (98.5 F) 89 16 97 % 1.778 m (5' 10) 77.1 kg (170 lb)          General: appropriate for age. in no acute distress.    Vital signs are present above and have been reviewed by me     HEENT: Atraumatic, Normocephalic. PERRLA, EOMI. Nose clear. Throat clear.    Lungs: Nonlabored breathing with symmetric expansion.  Clear to auscultation bilaterally    Heart:Regular wth respect to rate and rythmn.    Abdomen:Soft. Nontender. Nondistended and benign    Extremities:  Grossly normal with good range of motion and no major deformities.    Neuro:  Grossly normal motor and sensory function. CN's II through XII intact.    Psychiatric: Alert and oriented to person, place, and time. affect appropriate    Laboratory Data:     No results found for any visits on 10/08/24 (from  the past 24 hours).    Imaging Studies:    No orders to display        Assessment/Plan:  Anemia, unspecified type [D64.9]    EGD and colonoscopy scheduled for 10/08/2024 at 7:00 a.m.     Discussed indications, risks and benefits of esophagogastroduodenoscopy and colonoscopy with the patient.  Discussed the possibility of polypectomy, biopsies, and possible repeat examinations.  Risks include bleeding, sedation risks, possibility of missed diagnosis of polyp or malignancy, and remote possibilities of perforation and death.  All questions were answered, and informed consent was clearly obtained.    This note was partially created using voice recognition software and is inherently subject to errors including those of syntax and sound alike  substitutions which may escape proof reading. In such instances, original meaning may be extrapolated by contextual derivation.    Cantrell Larouche B Lashane Whelpley, MD, MBA, FACS         [1]   Social History  Tobacco Use    Smoking status: Every Day     Current packs/day: 1.00     Types: Cigarettes     Passive exposure: Never    Smokeless tobacco: Never   Vaping Use    Vaping status: Never Used   Substance Use Topics    Alcohol use: Yes     Comment: social    Drug use: Never   [2] No Known Allergies

## 2024-10-08 NOTE — Anesthesia Postprocedure Evaluation (Signed)
 Anesthesia Post Op Evaluation    Patient: Stephanie Gallegos  Procedure(s):  EGD WITH BIOPSY  COLONOSCOPY    Last Vitals:Temperature: 36.4 C (97.6 F) (10/08/24 0755)  Heart Rate: 85 (10/08/24 0755)  BP (Non-Invasive): 116/81 (10/08/24 0755)  Respiratory Rate: 18 (10/08/24 0755)  SpO2: 96 % (10/08/24 0755)    No notable events documented.    Patient is sufficiently recovered from the effects of anesthesia to participate in the evaluation and has returned to their pre-procedure level.  Patient location during evaluation: bedside       Patient participation: complete - patient participated  Level of consciousness: awake and alert    Pain score: 0  Pain management: adequate  Airway patency: patent    Anesthetic complications: no  Cardiovascular status: acceptable  Respiratory status: acceptable  Hydration status: acceptable  Patient post-procedure temperature: Pt Normothermic   PONV Status: Absent

## 2024-10-08 NOTE — Discharge Instructions (Signed)
 SURGICAL DISCHARGE INSTRUCTIONS     Dr. Marlyce, Gene B, MD  performed your EGD WITH BIOPSY, COLONOSCOPY today at the [facility]  Findings: EGD: Moderate Hiatal Hernia, Grade B Esophagitis, Mild Bile Reflux  Normal Colonoscopy     [facility]:  Monday through Friday from 8 a.m. - 4 p.m.: [phone]    For T&D: [phone]  Between 4 p.m. - 8 a.m., weekends and holidays:  Call ER [phone]    PLEASE SEE WRITTEN HANDOUTS AS DISCUSSED BY YOUR NURSE.    ANESTHESIA INFORMATION   ANESTHESIA -- ADULT PATIENTS:  You have received intravenous sedation / general anesthesia, and you may feel drowsy and light-headed for several hours. You may even experience some forgetfulness of the procedure. DO NOT DRIVE A MOTOR VEHICLE or perform any activity requiring complete alertness or coordination until you feel fully awake in about 24-48 hours. Do not drink alcoholic beverages for at least 24 hours. Do not stay alone, you must have a responsible adult available to be with you. You may also experience a dry mouth or nausea for 24 hours. This is a normal side effect and will disappear as the effects of the medication wear off.    REMEMBER   If you experience any difficulty breathing, chest pain, bleeding that you feel is excessive, persistent nausea or vomiting or for any other concerns:  Call your physician Dr. Marlyce, Amaryllis NOVAK, MD at [phone] . You may also ask to have the general doctor on call paged. They are available to you 24 hours a day.    FOLLOW-UP APPOINTMENTS   Follow-up with Dr. Marlyce if you have any new or worsening symptoms.  Repeat colonoscopy in 10 years.

## 2024-10-09 LAB — SURGICAL PATHOLOGY SPECIMEN

## 2024-10-16 ENCOUNTER — Other Ambulatory Visit (INDEPENDENT_AMBULATORY_CARE_PROVIDER_SITE_OTHER): Payer: Self-pay | Admitting: Internal Medicine

## 2024-10-16 ENCOUNTER — Encounter (INDEPENDENT_AMBULATORY_CARE_PROVIDER_SITE_OTHER): Payer: Self-pay | Admitting: Internal Medicine

## 2024-10-16 DIAGNOSIS — Z1231 Encounter for screening mammogram for malignant neoplasm of breast: Secondary | ICD-10-CM

## 2024-10-16 DIAGNOSIS — D649 Anemia, unspecified: Secondary | ICD-10-CM

## 2024-10-16 NOTE — Telephone Encounter (Signed)
 Patient needs CBC, Iron , TIBC, Ferritin, %sat,

## 2024-10-17 ENCOUNTER — Ambulatory Visit: Attending: NURSE PRACTITIONER

## 2024-10-17 ENCOUNTER — Other Ambulatory Visit: Payer: Self-pay

## 2024-10-17 DIAGNOSIS — D649 Anemia, unspecified: Secondary | ICD-10-CM | POA: Insufficient documentation

## 2024-10-17 LAB — CBC WITH DIFF
BASOPHIL #: 0 x10ˆ3/uL (ref 0.00–0.10)
BASOPHIL %: 1 % (ref 0–1)
EOSINOPHIL #: 0.1 x10ˆ3/uL (ref 0.00–0.50)
EOSINOPHIL %: 2 % (ref 1–7)
HCT: 35.9 % (ref 31.2–41.9)
HGB: 12.2 g/dL (ref 10.9–14.3)
LYMPHOCYTE #: 1 x10ˆ3/uL — ABNORMAL LOW (ref 1.10–3.10)
LYMPHOCYTE %: 24 % (ref 16–46)
MCH: 30.6 pg (ref 24.7–32.8)
MCHC: 33.9 g/dL (ref 32.3–35.6)
MCV: 90 fL (ref 75.5–95.3)
MONOCYTE #: 0.4 x10ˆ3/uL (ref 0.20–0.90)
MONOCYTE %: 9 % (ref 4–11)
MPV: 7.3 fL — ABNORMAL LOW (ref 7.9–10.8)
NEUTROPHIL #: 2.7 x10ˆ3/uL (ref 1.90–8.20)
NEUTROPHIL %: 65 % (ref 43–77)
PLATELETS: 212 x10ˆ3/uL (ref 140–440)
RBC: 3.99 x10ˆ6/uL (ref 3.63–4.92)
RDW: 18.3 % — ABNORMAL HIGH (ref 12.3–17.7)
WBC: 4.2 x10ˆ3/uL (ref 3.8–11.8)

## 2024-10-17 LAB — IRON TRANSFERRIN AND TIBC
IRON (TRANSFERRIN) SATURATION: 12 % — ABNORMAL LOW (ref 15–50)
IRON: 58 ug/dL (ref 50–212)
TOTAL IRON BINDING CAPACITY: 469 ug/dL — ABNORMAL HIGH (ref 250–450)
TRANSFERRIN: 335 mg/dL (ref 203–362)
UIBC: 411 ug/dL — ABNORMAL HIGH (ref 130–375)

## 2024-10-17 LAB — FERRITIN: FERRITIN: 16 ng/mL (ref 11–307)

## 2024-10-17 NOTE — Progress Notes (Signed)
 Labs collected.

## 2024-10-20 NOTE — Telephone Encounter (Signed)
 Her iron  studies and her CBC are completely normal

## 2024-11-05 ENCOUNTER — Ambulatory Visit (INDEPENDENT_AMBULATORY_CARE_PROVIDER_SITE_OTHER): Admitting: Internal Medicine

## 2024-11-05 ENCOUNTER — Other Ambulatory Visit (INDEPENDENT_AMBULATORY_CARE_PROVIDER_SITE_OTHER): Payer: Self-pay | Admitting: Internal Medicine

## 2024-11-05 MED ORDER — PANTOPRAZOLE 40 MG TABLET,DELAYED RELEASE: 40.0000 mg | DELAYED_RELEASE_TABLET | Freq: Two times a day (BID) | ORAL | 0 refills | Status: AC

## 2024-11-05 MED ORDER — NEBIVOLOL 5 MG TABLET: 5.0000 mg | ORAL_TABLET | Freq: Every day | ORAL | 2 refills | Status: AC

## 2024-11-23 ENCOUNTER — Other Ambulatory Visit (INDEPENDENT_AMBULATORY_CARE_PROVIDER_SITE_OTHER): Payer: Self-pay | Admitting: Internal Medicine

## 2025-01-20 ENCOUNTER — Ambulatory Visit (INDEPENDENT_AMBULATORY_CARE_PROVIDER_SITE_OTHER): Admitting: Internal Medicine

## 2025-06-02 ENCOUNTER — Ambulatory Visit (INDEPENDENT_AMBULATORY_CARE_PROVIDER_SITE_OTHER): Payer: Self-pay | Admitting: Internal Medicine
